# Patient Record
Sex: Female | Born: 1970
Health system: Southern US, Community
[De-identification: ages and names within clinical notes are randomized; demographics above are authoritative.]

## PROBLEM LIST (undated history)

## (undated) DIAGNOSIS — R109 Unspecified abdominal pain: Secondary | ICD-10-CM

## (undated) DIAGNOSIS — F419 Anxiety disorder, unspecified: Secondary | ICD-10-CM

## (undated) DIAGNOSIS — E781 Pure hyperglyceridemia: Secondary | ICD-10-CM

## (undated) DIAGNOSIS — F329 Major depressive disorder, single episode, unspecified: Secondary | ICD-10-CM

## (undated) DIAGNOSIS — E559 Vitamin D deficiency, unspecified: Secondary | ICD-10-CM

## (undated) DIAGNOSIS — E538 Deficiency of other specified B group vitamins: Secondary | ICD-10-CM

## (undated) DIAGNOSIS — F32A Depression, unspecified: Secondary | ICD-10-CM

## (undated) DIAGNOSIS — G5601 Carpal tunnel syndrome, right upper limb: Secondary | ICD-10-CM

## (undated) DIAGNOSIS — D509 Iron deficiency anemia, unspecified: Secondary | ICD-10-CM

## (undated) DIAGNOSIS — R5383 Other fatigue: Secondary | ICD-10-CM

## (undated) HISTORY — DX: Other fatigue: R53.83

## (undated) HISTORY — PX: ESSURE TUBAL LIGATION: SUR464

## (undated) HISTORY — DX: Unspecified abdominal pain: R10.9

## (undated) HISTORY — PX: COLONOSCOPY: SHX174

## (undated) HISTORY — DX: Deficiency of other specified B group vitamins: E53.8

---
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
## 1998-01-11 ENCOUNTER — Other Ambulatory Visit: Admission: RE | Admit: 1998-01-11 | Discharge: 1998-01-11 | Payer: Self-pay | Admitting: Obstetrics & Gynecology

## 1999-01-02 ENCOUNTER — Other Ambulatory Visit: Admission: RE | Admit: 1999-01-02 | Discharge: 1999-01-02 | Payer: Self-pay | Admitting: Obstetrics & Gynecology

## 2002-01-29 ENCOUNTER — Other Ambulatory Visit: Admission: RE | Admit: 2002-01-29 | Discharge: 2002-01-29 | Payer: Self-pay | Admitting: Obstetrics and Gynecology

## 2003-07-16 ENCOUNTER — Other Ambulatory Visit: Admission: RE | Admit: 2003-07-16 | Discharge: 2003-07-16 | Payer: Self-pay | Admitting: Obstetrics and Gynecology

## 2004-08-27 HISTORY — PX: SKULL FRACTURE ELEVATION: SHX781

## 2005-04-27 ENCOUNTER — Other Ambulatory Visit: Admission: RE | Admit: 2005-04-27 | Discharge: 2005-04-27 | Payer: Self-pay | Admitting: Obstetrics and Gynecology

## 2005-09-17 ENCOUNTER — Ambulatory Visit: Payer: Self-pay | Admitting: Internal Medicine

## 2006-05-29 ENCOUNTER — Ambulatory Visit: Payer: Self-pay | Admitting: Internal Medicine

## 2006-08-29 ENCOUNTER — Ambulatory Visit: Payer: Self-pay | Admitting: Internal Medicine

## 2007-01-06 ENCOUNTER — Ambulatory Visit: Payer: Self-pay | Admitting: Internal Medicine

## 2007-01-08 ENCOUNTER — Ambulatory Visit: Payer: Self-pay | Admitting: Internal Medicine

## 2007-03-12 ENCOUNTER — Ambulatory Visit: Payer: Self-pay | Admitting: Internal Medicine

## 2007-03-12 DIAGNOSIS — R42 Dizziness and giddiness: Secondary | ICD-10-CM | POA: Insufficient documentation

## 2007-03-13 ENCOUNTER — Telehealth: Payer: Self-pay | Admitting: Internal Medicine

## 2007-03-13 ENCOUNTER — Encounter: Payer: Self-pay | Admitting: Internal Medicine

## 2007-03-13 LAB — CONVERTED CEMR LAB
Bacteria, UA: NONE SEEN
RBC / HPF: NONE SEEN (ref <3)

## 2007-03-14 ENCOUNTER — Encounter (INDEPENDENT_AMBULATORY_CARE_PROVIDER_SITE_OTHER): Payer: Self-pay | Admitting: *Deleted

## 2007-03-16 ENCOUNTER — Encounter: Admission: RE | Admit: 2007-03-16 | Discharge: 2007-03-16 | Payer: Self-pay | Admitting: Internal Medicine

## 2007-03-17 ENCOUNTER — Encounter (INDEPENDENT_AMBULATORY_CARE_PROVIDER_SITE_OTHER): Payer: Self-pay | Admitting: *Deleted

## 2007-03-18 ENCOUNTER — Encounter (INDEPENDENT_AMBULATORY_CARE_PROVIDER_SITE_OTHER): Payer: Self-pay | Admitting: *Deleted

## 2007-03-18 ENCOUNTER — Telehealth (INDEPENDENT_AMBULATORY_CARE_PROVIDER_SITE_OTHER): Payer: Self-pay | Admitting: *Deleted

## 2007-12-08 ENCOUNTER — Encounter: Admission: RE | Admit: 2007-12-08 | Discharge: 2007-12-08 | Payer: Self-pay | Admitting: Obstetrics and Gynecology

## 2007-12-26 ENCOUNTER — Encounter: Admission: RE | Admit: 2007-12-26 | Discharge: 2007-12-26 | Payer: Self-pay | Admitting: Obstetrics and Gynecology

## 2008-02-18 ENCOUNTER — Ambulatory Visit: Payer: Self-pay | Admitting: Internal Medicine

## 2008-02-18 LAB — CONVERTED CEMR LAB: Rapid Strep: NEGATIVE

## 2008-05-10 ENCOUNTER — Ambulatory Visit: Payer: Self-pay | Admitting: Internal Medicine

## 2008-05-13 ENCOUNTER — Telehealth: Payer: Self-pay | Admitting: Internal Medicine

## 2008-05-31 ENCOUNTER — Ambulatory Visit: Payer: Self-pay | Admitting: Family Medicine

## 2008-05-31 DIAGNOSIS — E54 Ascorbic acid deficiency: Secondary | ICD-10-CM

## 2008-05-31 DIAGNOSIS — S9030XA Contusion of unspecified foot, initial encounter: Secondary | ICD-10-CM

## 2008-06-01 ENCOUNTER — Telehealth (INDEPENDENT_AMBULATORY_CARE_PROVIDER_SITE_OTHER): Payer: Self-pay | Admitting: *Deleted

## 2008-07-26 ENCOUNTER — Encounter: Payer: Self-pay | Admitting: Gastroenterology

## 2008-07-28 ENCOUNTER — Encounter: Payer: Self-pay | Admitting: Gastroenterology

## 2009-03-07 ENCOUNTER — Ambulatory Visit: Payer: Self-pay | Admitting: Internal Medicine

## 2009-03-07 DIAGNOSIS — K5900 Constipation, unspecified: Secondary | ICD-10-CM | POA: Insufficient documentation

## 2009-03-07 DIAGNOSIS — R5381 Other malaise: Secondary | ICD-10-CM | POA: Insufficient documentation

## 2009-03-07 DIAGNOSIS — R141 Gas pain: Secondary | ICD-10-CM

## 2009-03-07 DIAGNOSIS — R5383 Other fatigue: Secondary | ICD-10-CM

## 2009-03-07 DIAGNOSIS — R109 Unspecified abdominal pain: Secondary | ICD-10-CM | POA: Insufficient documentation

## 2009-03-07 DIAGNOSIS — R143 Flatulence: Secondary | ICD-10-CM

## 2009-03-07 DIAGNOSIS — R142 Eructation: Secondary | ICD-10-CM

## 2009-03-07 LAB — CONVERTED CEMR LAB
Bilirubin Urine: NEGATIVE
Ketones, urine, test strip: NEGATIVE
Nitrite: NEGATIVE
Specific Gravity, Urine: 1.015
Urobilinogen, UA: 0.2

## 2009-03-08 ENCOUNTER — Encounter: Payer: Self-pay | Admitting: Internal Medicine

## 2009-03-10 ENCOUNTER — Encounter (INDEPENDENT_AMBULATORY_CARE_PROVIDER_SITE_OTHER): Payer: Self-pay | Admitting: *Deleted

## 2009-03-10 ENCOUNTER — Telehealth (INDEPENDENT_AMBULATORY_CARE_PROVIDER_SITE_OTHER): Payer: Self-pay | Admitting: *Deleted

## 2009-03-11 LAB — CONVERTED CEMR LAB
Alkaline Phosphatase: 32 units/L — ABNORMAL LOW (ref 39–117)
Amylase: 64 units/L (ref 27–131)
Bilirubin, Direct: 0.2 mg/dL (ref 0.0–0.3)
Eosinophils Absolute: 0.2 10*3/uL (ref 0.0–0.7)
Free T4: 0.8 ng/dL (ref 0.6–1.6)
Lipase: 12 units/L (ref 11.0–59.0)
Lymphocytes Relative: 17.9 % (ref 12.0–46.0)
MCHC: 33.8 g/dL (ref 30.0–36.0)
MCV: 98.5 fL (ref 78.0–100.0)
Monocytes Absolute: 0.2 10*3/uL (ref 0.1–1.0)
Neutrophils Relative %: 78.2 % — ABNORMAL HIGH (ref 43.0–77.0)
Platelets: 262 10*3/uL (ref 150.0–400.0)
RBC: 4.03 M/uL (ref 3.87–5.11)
Total Bilirubin: 1 mg/dL (ref 0.3–1.2)
WBC: 11.3 10*3/uL — ABNORMAL HIGH (ref 4.5–10.5)

## 2009-04-05 ENCOUNTER — Ambulatory Visit: Payer: Self-pay | Admitting: Gastroenterology

## 2009-04-05 ENCOUNTER — Telehealth: Payer: Self-pay | Admitting: Gastroenterology

## 2009-04-05 DIAGNOSIS — E538 Deficiency of other specified B group vitamins: Secondary | ICD-10-CM | POA: Insufficient documentation

## 2009-04-05 LAB — CONVERTED CEMR LAB
Ferritin: 53.2 ng/mL (ref 10.0–291.0)
Sed Rate: 10 mm/hr (ref 0–22)
Vitamin B-12: 94 pg/mL — ABNORMAL LOW (ref 211–911)

## 2009-04-06 ENCOUNTER — Ambulatory Visit: Payer: Self-pay | Admitting: Gastroenterology

## 2009-04-14 ENCOUNTER — Telehealth (INDEPENDENT_AMBULATORY_CARE_PROVIDER_SITE_OTHER): Payer: Self-pay | Admitting: *Deleted

## 2009-04-19 ENCOUNTER — Ambulatory Visit: Payer: Self-pay | Admitting: Gastroenterology

## 2009-04-27 ENCOUNTER — Ambulatory Visit: Payer: Self-pay | Admitting: Gastroenterology

## 2009-05-20 ENCOUNTER — Ambulatory Visit: Payer: Self-pay | Admitting: Gastroenterology

## 2009-05-23 ENCOUNTER — Ambulatory Visit: Payer: Self-pay | Admitting: Gastroenterology

## 2009-05-23 ENCOUNTER — Telehealth (INDEPENDENT_AMBULATORY_CARE_PROVIDER_SITE_OTHER): Payer: Self-pay | Admitting: *Deleted

## 2009-05-23 DIAGNOSIS — R1084 Generalized abdominal pain: Secondary | ICD-10-CM | POA: Insufficient documentation

## 2009-05-23 DIAGNOSIS — R109 Unspecified abdominal pain: Secondary | ICD-10-CM | POA: Insufficient documentation

## 2010-04-13 ENCOUNTER — Ambulatory Visit: Payer: Self-pay | Admitting: Internal Medicine

## 2010-04-13 DIAGNOSIS — J019 Acute sinusitis, unspecified: Secondary | ICD-10-CM

## 2010-08-11 ENCOUNTER — Ambulatory Visit (HOSPITAL_COMMUNITY)
Admission: RE | Admit: 2010-08-11 | Discharge: 2010-08-11 | Payer: Self-pay | Source: Home / Self Care | Attending: Obstetrics and Gynecology | Admitting: Obstetrics and Gynecology

## 2010-09-17 ENCOUNTER — Encounter: Payer: Self-pay | Admitting: Obstetrics and Gynecology

## 2010-09-24 LAB — CONVERTED CEMR LAB
Basophils Relative: 0.6 % (ref 0.0–1.0)
Beta hcg, urine, semiquantitative: NEGATIVE
Bilirubin Urine: NEGATIVE
CO2: 27 meq/L (ref 19–32)
Calcium: 9.6 mg/dL (ref 8.4–10.5)
Chloride: 105 meq/L (ref 96–112)
Creatinine, Ser: 0.7 mg/dL (ref 0.4–1.2)
Eosinophils Relative: 1.6 % (ref 0.0–5.0)
GFR calc non Af Amer: 101 mL/min
Glucose, Bld: 86 mg/dL (ref 70–99)
HCT: 40.2 % (ref 36.0–46.0)
Hemoglobin: 14.4 g/dL
Ketones, urine, test strip: NEGATIVE
MCV: 94.3 fL (ref 78.0–100.0)
Neutrophils Relative %: 71.6 % (ref 43.0–77.0)
Nitrite: NEGATIVE
Platelets: 347 10*3/uL (ref 150–400)
RBC: 4.26 M/uL (ref 3.87–5.11)
RDW: 12 % (ref 11.5–14.6)
Sodium: 140 meq/L (ref 135–145)
Specific Gravity, Urine: 1.01
WBC: 9.6 10*3/uL (ref 4.5–10.5)
pH: 7

## 2010-09-26 NOTE — Assessment & Plan Note (Signed)
Summary: HEAD CONGESTION, NO COUGH, SINCE SUNDAY, NO FEVER///SPH   Vital Signs:  Patient profile:   40 year old female Height:      68 inches (172.72 cm) Weight:      139 pounds (63.18 kg) BMI:     21.21 Temp:     98.3 degrees F (36.83 degrees C) oral Resp:     14  per minute BP sitting:   110 / 74  (left arm) Cuff size:   regular  Vitals Entered By: Lucious Groves CMA (April 13, 2010 1:51 PM) CC: C/O congestion x5 days./kb, URI symptoms Is Patient Diabetic? No Pain Assessment Patient in pain? yes     Location: body aches Intensity: 4-5 Type: aching Onset of pain  Sunday Comments Patient states that she is producing yellow mucous and has HA, but denies fever and cough. Patient also notes that the only med she is taking is the birth control. Lucious Groves CMA  April 13, 2010 1:52 PM    Primary Care Provider:  Chrissie Noa Hopper,MD  CC:  C/O congestion x5 days./kb and URI symptoms.  History of Present Illness:  URI Symptoms      This is a 40 year old woman who presents with URI symptoms since 08/14.  The patient reports nasal congestion and purulent nasal discharge, but denies sore throat, productive cough, and earache.  The patient denies fever, dyspnea, and wheezing.  The patient also reports frontal  headache.  The patient denies itchy watery eyes and sneezing.  Risk factors for Strep sinusitis include  facial pain.  The patient denies the following risk factors for Strep sinusitis: tooth pain.   Rx: Nyquil, decongestant  Current Medications (verified): 1)  Bcp 2)  Amitiza 8 Mcg  Caps (Lubiprostone) .Marland Kitchen.. 1 Two Times A Day/take With Food and Water 3)  Miralax   Powd (Polyethylene Glycol 3350) .... Mix One Packet in [employer] and Drink At Night 4)  Folic Acid 1 Mg Tabs (Folic Acid) .... Take One By Mouth Once Daily 5)  Nascobal 500 Mcg/0.82ml Soln (Cyanocobalamin) .... One Spray, One Nostril, Once A Week  Allergies (verified): 1)  ! Pcn  Physical Exam  General:  in no acute  distress; alert,appropriate and cooperative throughout examination Ears:  External ear exam shows no significant lesions or deformities.  Otoscopic examination reveals clear canals, tympanic membranes are intact bilaterally without bulging, retraction, inflammation or discharge. Hearing is grossly normal bilaterally. Nose:  External nasal examination shows no deformity or inflammation. Nasal mucosa are pink and moist without lesions or exudates. Hyponasal speech Mouth:  Oral mucosa and oropharynx without lesions or exudates.  Teeth in good repair No pharyngeal erythema.   Slightly hoarse Lungs:  Normal respiratory effort, chest expands symmetrically. Lungs are clear to auscultation, no crackles or wheezes. Cervical Nodes:  No lymphadenopathy noted Axillary Nodes:  No palpable lymphadenopathy   Impression & Recommendations:  Problem # 1:  SINUSITIS- ACUTE-NOS (ICD-461.9)  Her updated medication list for this problem includes:    Clarithromycin 500 Mg Xr24h-tab (Clarithromycin) .Marland Kitchen... 2 once daily with a meal    Fluticasone Propionate 50 Mcg/act Susp (Fluticasone propionate) .Marland Kitchen... 1 spray two times a day  Complete Medication List: 1)  Bcp  2)  Amitiza 8 Mcg Caps (Lubiprostone) .Marland Kitchen.. 1 two times a day/take with food and water 3)  Miralax Powd (Polyethylene glycol 3350) .... Mix one packet in water and drink at night 4)  Folic Acid 1 Mg Tabs (Folic acid) .... Take one by mouth once  daily 5)  Nascobal 500 Mcg/0.59ml Soln (Cyanocobalamin) .... One spray, one nostril, once a week 6)  Clarithromycin 500 Mg Xr24h-tab (Clarithromycin) .... 2 once daily with a meal 7)  Fluticasone Propionate 50 Mcg/act Susp (Fluticasone propionate) .Marland Kitchen.. 1 spray two times a day  Patient Instructions: 1)  Neti pot once daily until sinuses are clear. 2)  Drink as much fluid as you can tolerate for the next few days. Prescriptions: FLUTICASONE PROPIONATE 50 MCG/ACT SUSP (FLUTICASONE PROPIONATE) 1 spray two times a day   #1 x 5   Entered and Authorized by:   Marga Melnick MD   Signed by:   Marga Melnick MD on 04/13/2010   Method used:   Faxed to ...       [facility]  [address] (retail)       [address]       [place], [place]  [address]       Ph: [phone] or [phone]       Fax: [phone]   RxID:   [id number] CLARITHROMYCIN 500 MG XR24H-TAB (CLARITHROMYCIN) 2 once daily with a meal  #20 x 0   Entered and Authorized by:   Marga Melnick MD   Signed by:   Marga Melnick MD on 04/13/2010   Method used:   Faxed to ...       [facility]  [address] (retail)       [address]       [place], [place]  [address]       Ph: [phone] or [phone]       Fax: [phone]   RxID:   [id number]

## 2011-08-29 ENCOUNTER — Ambulatory Visit (INDEPENDENT_AMBULATORY_CARE_PROVIDER_SITE_OTHER): Payer: PRIVATE HEALTH INSURANCE | Admitting: Family Medicine

## 2011-08-29 ENCOUNTER — Encounter: Payer: Self-pay | Admitting: Family Medicine

## 2011-08-29 ENCOUNTER — Telehealth: Payer: Self-pay

## 2011-08-29 VITALS — BP 118/83 | HR 82 | Temp 98.2°F | Wt 143.0 lb

## 2011-08-29 DIAGNOSIS — J069 Acute upper respiratory infection, unspecified: Secondary | ICD-10-CM

## 2011-08-29 DIAGNOSIS — J111 Influenza due to unidentified influenza virus with other respiratory manifestations: Secondary | ICD-10-CM

## 2011-08-29 DIAGNOSIS — R6889 Other general symptoms and signs: Secondary | ICD-10-CM

## 2011-08-29 MED ORDER — HYDROCODONE-HOMATROPINE 5-1.5 MG/5ML PO SYRP: ORAL_SOLUTION | ORAL | Status: DC
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
## 2011-08-29 NOTE — Assessment & Plan Note (Addendum)
Suspect flu-like illness.   Also with slight suggestion of RAD. Symptomatic care at this point: hycodan 1-2 tsp q6h prn. Mucinex DM as directed in daytime. Saline nasal spray 2-3 times per day.   OTC generic afrin as directed.  Therapeutic expectations and side effect profile of medication discussed today.  Patient's questions answered.

## 2011-08-29 NOTE — Patient Instructions (Signed)
Buy generic afrin and use as directed for nasal congestion. Buy saline nasal spray and use 2-3 sprays each nostril 2-3 times per day.

## 2011-08-29 NOTE — Telephone Encounter (Signed)
Patient called c/o sore throat, congestion, chills and aches. Patient aware all schedules booked here. Patient was offered to be seen at [facility], patient ok'd.  I contacted the [facility] location to check availability, instructed to have patient contact there office to schedule 10:30 or 10:45.  Number given to patient, patient will call for appointment for Acute Visit only

## 2011-09-03 ENCOUNTER — Ambulatory Visit (INDEPENDENT_AMBULATORY_CARE_PROVIDER_SITE_OTHER): Payer: PRIVATE HEALTH INSURANCE | Admitting: Family Medicine

## 2011-09-03 ENCOUNTER — Encounter: Payer: Self-pay | Admitting: Family Medicine

## 2011-09-03 VITALS — BP 127/85 | HR 71 | Temp 97.8°F | Wt 140.0 lb

## 2011-09-03 DIAGNOSIS — J019 Acute sinusitis, unspecified: Secondary | ICD-10-CM

## 2011-09-03 DIAGNOSIS — J45909 Unspecified asthma, uncomplicated: Secondary | ICD-10-CM

## 2011-09-03 MED ORDER — PREDNISONE 20 MG PO TABS: ORAL_TABLET | ORAL | Status: DC

## 2011-09-03 MED ORDER — ALBUTEROL SULFATE (2.5 MG/3ML) 0.083% IN NEBU
2.5000 mg | INHALATION_SOLUTION | RESPIRATORY_TRACT | Status: DC
  Administered 2011-09-03: 2.5 mg via RESPIRATORY_TRACT

## 2011-09-03 MED ORDER — BENZONATATE 200 MG PO CAPS: 200.0000 mg | ORAL_CAPSULE | Freq: Three times a day (TID) | ORAL | Status: AC | PRN

## 2011-09-03 MED ORDER — AZITHROMYCIN 250 MG PO TABS: ORAL_TABLET | ORAL | Status: DC

## 2011-09-03 MED ORDER — ALBUTEROL SULFATE HFA 108 (90 BASE) MCG/ACT IN AERS: 2.0000 | INHALATION_SPRAY | Freq: Four times a day (QID) | RESPIRATORY_TRACT | Status: DC | PRN

## 2011-09-03 NOTE — Progress Notes (Signed)
OFFICE NOTE  09/03/2011  CC:  Chief Complaint  Patient presents with  . Cough    cough and congestion worse     HPI: Patient is a 41 y.o. Caucasian female who is here for ongoing respiratory complaints. Ten days total of URI sx's and coughing, coughing getting worse/chest tight/wheezy. Fevers finally stopped 3d/a, body aches are gone. She took hycodan for a couple of days q6h and then began to note significant itching all over and felt her gums swelling. She stopped it and these things resolved.  She says she has taken vicodin in the past w/out problem. No rash.  Mild HA at times.  No ST.  Pertinent PMH:  Past Medical History  Diagnosis Date  . Vitamin B12 deficiency   . Constipation   . Fatigue   . Abdominal pain, recurrent   Nonsmoker. No hx of asthma.  Pertinent Meds:  None currently  PE: Blood pressure 127/85, pulse 71, temperature 97.8 F (36.6 C), temperature source Temporal, weight 140 lb (63.504 kg), SpO2 99.00%. Gen: Alert, tired-appearing but in NAD/nontoxic.  Patient is oriented to person, place, time, and situation. VS: noted--normal. HEENT: eyes without injection, drainage, or swelling.  Ears: EACs clear, TMs with normal light reflex and landmarks.  Nose: Clear rhinorrhea, with some dried, crusty exudate adherent to mildly injected mucosa.  No purulent d/c.  No paranasal sinus TTP.  No facial swelling.  Throat and mouth without focal lesion.  No pharyngial swelling, erythema, or exudate.   Neck: supple, no LAD.   LUNGS: CTA bilat on inspiration, but has some coarse wheeze diffusely on exhalation with lots of post-exhalational coughing, nonlabored resps.  CV: RRR, no m/r/g. EXT: no c/c/e SKIN: no rash  Lab: none  IMPRESSION AND PLAN: Sinusitis with acute asthmatic bronchitis--likely all complications from flu-like illness recently. Albut neb 2.5mg  in office today: improved aeration, less coughing. Question of allergy to hycodan (?homotropine  component). Start tessalon perles, Ventolin HFA 2 p q4h prn, and prednisone 40mg  qd x 5, then 20mg  qd x 5d. Therapeutic expectations and side effect profile of medication discussed today.  Patient's questions answered.   FOLLOW UP: 1 wk

## 2011-09-10 ENCOUNTER — Encounter: Payer: Self-pay | Admitting: Internal Medicine

## 2011-09-10 ENCOUNTER — Ambulatory Visit (INDEPENDENT_AMBULATORY_CARE_PROVIDER_SITE_OTHER): Payer: PRIVATE HEALTH INSURANCE | Admitting: Internal Medicine

## 2011-09-10 ENCOUNTER — Ambulatory Visit (INDEPENDENT_AMBULATORY_CARE_PROVIDER_SITE_OTHER)
Admission: RE | Admit: 2011-09-10 | Discharge: 2011-09-10 | Disposition: A | Discharge status: Home / Self Care | Payer: PRIVATE HEALTH INSURANCE | Source: Ambulatory Visit | Attending: Internal Medicine | Admitting: Internal Medicine

## 2011-09-10 VITALS — BP 122/82 | HR 82 | Temp 98.7°F | Resp 16 | Wt 142.4 lb

## 2011-09-10 DIAGNOSIS — R05 Cough: Secondary | ICD-10-CM

## 2011-09-10 DIAGNOSIS — R059 Cough, unspecified: Secondary | ICD-10-CM

## 2011-09-10 MED ORDER — FLUTICASONE-SALMETEROL 250-50 MCG/DOSE IN AEPB: 1.0000 | INHALATION_SPRAY | Freq: Two times a day (BID) | RESPIRATORY_TRACT | Status: DC

## 2011-09-10 MED ORDER — MONTELUKAST SODIUM 10 MG PO TABS: 10.0000 mg | ORAL_TABLET | Freq: Every day | ORAL | Status: DC

## 2011-09-10 NOTE — Progress Notes (Signed)
  Subjective:    Patient ID: Alyssa Baker, female    DOB: Jul 14, 1971, 41 y.o.   MRN: [id number]  HPI Respiratory tract infection: Bronchitis and sinusitis treated at [facility] 09/04/11 with Z-Pak, Tessalon,albuterol MDI and prednisone. Progression of symptoms:initial improvement but cough and wheezing persist with SOB Present symptoms; Fever/chills/sweats:sweats & chills Frontal headache:no Facial pain:no Nasal purulence:no Sore throat:no Dental pain:no Lymphadenopathy:no Cough/sputum/hemoptysis:NP Pleuritic pain:no Associated extrinsic/allergic symptoms:itchy eyes/ sneezing:no Past medical history: Seasonal allergies;yes/asthma:no Smoking history:never     Review of Systems     Objective:   Physical Exam General appearance:thin but in good health ;well nourished; no acute distress or increased work of breathing is present.  No  lymphadenopathy about the head, neck, or axilla noted.   Eyes: No conjunctival inflammation or lid edema is present.   Ears:  External ear exam shows no significant lesions or deformities.  Otoscopic examination reveals clear canals, tympanic membranes are intact bilaterally without bulging, retraction, inflammation or discharge.  Nose:  External nasal examination shows no deformity or inflammation. Nasal mucosa are pink and moist without lesions or exudates. No septal dislocation or deviation.No obstruction to airflow.   Oral exam: Dental hygiene is good; lips and gums are healthy appearing.There is no oropharyngeal erythema or exudate noted.   Neck:  No deformities, thyromegaly, masses, or tenderness noted.   Supple with full range of motion without pain.   Heart:  Normal rate and regular rhythm. S1 and S2 normal without gallop, murmur, click, rub or other extra sounds.   Lungs:Chest clear to auscultation; no wheezes, rhonchi,rales ,or rubs present.No increased work of breathing. Although she exhibits no wheezing at this time; she coughs with almost every  deep inspiration.    Extremities:  No cyanosis, edema, or clubbing  noted    Skin: Warm but slightly damp        Assessment & Plan:   #1 reactive airways findings following respiratory infection. At this time she has no criteria for rhinosinusitis. The cough is nonproductive and most likely related to airway spasm.  Plan: See orders and recommendations

## 2011-09-10 NOTE — Patient Instructions (Signed)
Plain Mucinex for thick secretions ;force NON dairy fluids . Use a Neti pot daily as needed for sinus congestion .Order for x-rays entered into  the computer; these will be performed at [address] [facility]. across from [facility]. No appointment is necessary.

## 2012-04-01 ENCOUNTER — Ambulatory Visit (INDEPENDENT_AMBULATORY_CARE_PROVIDER_SITE_OTHER): Payer: Self-pay | Admitting: Family Medicine

## 2012-04-01 ENCOUNTER — Encounter: Payer: Self-pay | Admitting: Family Medicine

## 2012-04-01 VITALS — BP 116/72 | HR 80 | Temp 98.2°F | Wt 142.8 lb

## 2012-04-01 DIAGNOSIS — R197 Diarrhea, unspecified: Secondary | ICD-10-CM

## 2012-04-01 LAB — CBC WITH DIFFERENTIAL/PLATELET
Eosinophils Absolute: 0.3 10*3/uL (ref 0.0–0.7)
Eosinophils Relative: 3.3 % (ref 0.0–5.0)
HCT: 38.9 % (ref 36.0–46.0)
Lymphs Abs: 1.7 10*3/uL (ref 0.7–4.0)
MCHC: 33.4 g/dL (ref 30.0–36.0)
MCV: 98.6 fl (ref 78.0–100.0)
Monocytes Absolute: 0.6 10*3/uL (ref 0.1–1.0)
Platelets: 299 10*3/uL (ref 150.0–400.0)
RDW: 12.8 % (ref 11.5–14.6)
WBC: 7.8 10*3/uL (ref 4.5–10.5)

## 2012-04-01 LAB — BASIC METABOLIC PANEL
BUN: 14 mg/dL (ref 6–23)
CO2: 27 mEq/L (ref 19–32)
Chloride: 105 mEq/L (ref 96–112)
Glucose, Bld: 76 mg/dL (ref 70–99)
Potassium: 4.3 mEq/L (ref 3.5–5.1)

## 2012-04-01 LAB — LIPASE: Lipase: 30 U/L (ref 11.0–59.0)

## 2012-04-01 LAB — HEPATIC FUNCTION PANEL
Albumin: 4.4 g/dL (ref 3.5–5.2)
Alkaline Phosphatase: 43 U/L (ref 39–117)
Total Protein: 8 g/dL (ref 6.0–8.3)

## 2012-04-01 MED ORDER — HYOSCYAMINE SULFATE 0.125 MG SL SUBL: 0.1250 mg | SUBLINGUAL_TABLET | SUBLINGUAL | Status: DC | PRN

## 2012-04-01 NOTE — Progress Notes (Signed)
  Subjective:     Alyssa Baker is a 41 y.o. female who presents for evaluation of diarrhea 10 times per day, nausea and chills. Symptoms have been present for 7 days. Patient denies acholic stools, blood in stool, constipation, dark urine, dysuria, heartburn, hematemesis, hematuria and melena. Patient's oral intake has been decreased for liquids and decreased for solids. Patient's urine output has been decreased with 2 voids in 24 hours, the last void was a few hours ago. Other contacts with similar symptoms include: none. Patient denies recent travel history. Patient has not had recent ingestion of possible contaminated food, toxic plants, or inappropriate medications/poisons.   The following portions of the patient's history were reviewed and updated as appropriate: allergies, current medications, past family history, past medical history, past social history, past surgical history and problem list.  Review of Systems Pertinent items are noted in HPI.    Objective:     BP 116/72  Pulse 80  Temp 98.2 F (36.8 C) (Oral)  Wt 142 lb 12.8 oz (64.774 kg)  SpO2 97% General appearance: alert, cooperative, appears stated age and no distress Lungs: clear to auscultation bilaterally Heart: S1, S2 normal Abdomen: abnormal findings:  mild tenderness in the epigastrium    Assessment:    Diarrhea---  ? etiology  Plan:    1. Discussed oral rehydration, reintroduction of solid foods, signs of dehydration. 2. Return or go to emergency department if worsening symptoms, blood or bile, signs of dehydration, diarrhea lasting longer than 5 days or any new concerns. 3. Follow up in 2 weeks or sooner as needed.  4  Check cultures 5.  levsin SL prn

## 2012-04-01 NOTE — Patient Instructions (Signed)
Diarrhea Infections caused by germs (bacterial) or a virus commonly cause diarrhea. Your caregiver has determined that with time, rest and fluids, the diarrhea should improve. In general, eat normally while drinking more water than usual. Although water may prevent dehydration, it does not contain salt and minerals (electrolytes). Broths, weak tea without caffeine and oral rehydration solutions (ORS) replace fluids and electrolytes. Small amounts of fluids should be taken frequently. Large amounts at one time may not be tolerated. Plain water may be harmful in infants and the elderly. Oral rehydrating solutions (ORS) are available at pharmacies and grocery stores. ORS replace water and important electrolytes in proper proportions. Sports drinks are not as effective as ORS and may be harmful due to sugars worsening diarrhea.  ORS is especially recommended for use in children with diarrhea. As a general guideline for children, replace any new fluid losses from diarrhea and/or vomiting with ORS as follows:   If your child weighs 22 pounds or under (10 kg or less), give 60-120 mL ( -  cup or 2 - 4 ounces) of ORS for each episode of diarrheal stool or vomiting episode.   If your child weighs more than 22 pounds (more than 10 kgs), give 120-240 mL ( - 1 cup or 4 - 8 ounces) of ORS for each diarrheal stool or episode of vomiting.   While correcting for dehydration, children should eat normally. However, foods high in sugar should be avoided because this may worsen diarrhea. Large amounts of carbonated soft drinks, juice, gelatin desserts and other highly sugared drinks should be avoided.   After correction of dehydration, other liquids that are appealing to the child may be added. Children should drink small amounts of fluids frequently and fluids should be increased as tolerated. Children should drink enough fluids to keep urine clear or pale yellow.   Adults should eat normally while drinking more fluids  than usual. Drink small amounts of fluids frequently and increase as tolerated. Drink enough fluids to keep urine clear or pale yellow. Broths, weak decaffeinated tea, lemon lime soft drinks (allowed to go flat) and ORS replace fluids and electrolytes.   Avoid:   Carbonated drinks.   Juice.   Extremely hot or cold fluids.   Caffeine drinks.   Fatty, greasy foods.   Alcohol.   Tobacco.   Too much intake of anything at one time.   Gelatin desserts.   Probiotics are active cultures of beneficial bacteria. They may lessen the amount and number of diarrheal stools in adults. Probiotics can be found in yogurt with active cultures and in supplements.   Wash hands well to avoid spreading bacteria and virus.   Anti-diarrheal medications are not recommended for infants and children.   Only take over-the-counter or prescription medicines for pain, discomfort or fever as directed by your caregiver. Do not give aspirin to children because it may cause Reye's Syndrome.   For adults, ask your caregiver if you should continue all prescribed and over-the-counter medicines.   If your caregiver has given you a follow-up appointment, it is very important to keep that appointment. Not keeping the appointment could result in a chronic or permanent injury, and disability. If there is any problem keeping the appointment, you must call back to this facility for assistance.  SEEK IMMEDIATE MEDICAL CARE IF:   You or your child is unable to keep fluids down or other symptoms or problems become worse in spite of treatment.   Vomiting or diarrhea develops and becomes persistent.     There is vomiting of blood or bile (green material).   There is blood in the stool or the stools are black and tarry.   There is no urine output in 6-8 hours or there is only a small amount of very dark urine.   Abdominal pain develops, increases or localizes.   You have a fever.   Your baby is older than 3 months with a  rectal temperature of 102 F (38.9 C) or higher.   Your baby is 3 months old or younger with a rectal temperature of 100.4 F (38 C) or higher.   You or your child develops excessive weakness, dizziness, fainting or extreme thirst.   You or your child develops a rash, stiff neck, severe headache or become irritable or sleepy and difficult to awaken.  MAKE SURE YOU:   Understand these instructions.   Will watch your condition.   Will get help right away if you are not doing well or get worse.  Document Released: 08/03/2002 Document Revised: 08/02/2011 Document Reviewed: 06/20/2009 Exit[employer] Patient Information 2012 [employer]. 

## 2012-04-08 ENCOUNTER — Encounter: Payer: Self-pay | Admitting: *Deleted

## 2012-04-09 ENCOUNTER — Encounter: Payer: Self-pay | Admitting: *Deleted

## 2012-04-09 LAB — CLOSTRIDIUM DIFFICILE EIA: CDIFTX: NEGATIVE

## 2012-06-30 ENCOUNTER — Encounter: Payer: Self-pay | Admitting: Internal Medicine

## 2012-06-30 ENCOUNTER — Ambulatory Visit (INDEPENDENT_AMBULATORY_CARE_PROVIDER_SITE_OTHER): Payer: BC Managed Care – PPO | Admitting: Internal Medicine

## 2012-06-30 VITALS — BP 118/76 | HR 89 | Temp 98.6°F | Wt 150.2 lb

## 2012-06-30 DIAGNOSIS — IMO0001 Reserved for inherently not codable concepts without codable children: Secondary | ICD-10-CM

## 2012-06-30 DIAGNOSIS — G44309 Post-traumatic headache, unspecified, not intractable: Secondary | ICD-10-CM
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
## 2012-06-30 NOTE — Patient Instructions (Addendum)
Please keep a diary of your headaches . Document  each occurrence on the calendar with notation of : #1 any prodrome ( any non headache symptom such as marked fatigue,visual changes, ,etc ) which precedes actual headache ; #2) severity on 1-10 scale; #3) any triggers ( food/ drink,enviromenntal or weather changes ,physical or emotional stress) in 8-12 hour period prior to the headache; & #4) response to any medications or other intervention. Please review "Headache" @ [facility] for additional information.    Review and correct the record as indicated. Please share record with all medical staff seen.   If you activate My Chart; the results can be released to you as soon as they populate from the lab. If you choose not to use this program; the labs have to be reviewed, copied & mailed   causing a delay in getting the results to you.

## 2012-07-02 ENCOUNTER — Encounter: Payer: Self-pay | Admitting: Internal Medicine

## 2012-07-04 ENCOUNTER — Other Ambulatory Visit: Payer: BC Managed Care – PPO

## 2012-07-11 ENCOUNTER — Ambulatory Visit (INDEPENDENT_AMBULATORY_CARE_PROVIDER_SITE_OTHER)
Admission: RE | Admit: 2012-07-11 | Discharge: 2012-07-11 | Disposition: A | Discharge status: Home / Self Care | Payer: BC Managed Care – PPO | Source: Ambulatory Visit | Attending: Internal Medicine | Admitting: Internal Medicine

## 2012-07-11 DIAGNOSIS — S0990XS Unspecified injury of head, sequela: Secondary | ICD-10-CM

## 2012-07-11 DIAGNOSIS — G44309 Post-traumatic headache, unspecified, not intractable: Secondary | ICD-10-CM

## 2012-10-11 ENCOUNTER — Other Ambulatory Visit: Payer: Self-pay

## 2012-10-14 ENCOUNTER — Ambulatory Visit: Payer: BC Managed Care – PPO | Admitting: Family Medicine

## 2012-10-17 ENCOUNTER — Ambulatory Visit: Payer: BC Managed Care – PPO | Admitting: Internal Medicine

## 2012-11-20 ENCOUNTER — Other Ambulatory Visit: Payer: Self-pay

## 2012-11-20 DIAGNOSIS — Z1231 Encounter for screening mammogram for malignant neoplasm of breast: Secondary | ICD-10-CM

## 2012-12-12 ENCOUNTER — Ambulatory Visit: Payer: BC Managed Care – PPO

## 2012-12-16 ENCOUNTER — Ambulatory Visit
Admission: RE | Admit: 2012-12-16 | Discharge: 2012-12-16 | Disposition: A | Discharge status: Home / Self Care | Payer: PRIVATE HEALTH INSURANCE | Source: Ambulatory Visit

## 2012-12-16 DIAGNOSIS — Z1231 Encounter for screening mammogram for malignant neoplasm of breast: Secondary | ICD-10-CM

## 2013-03-19 ENCOUNTER — Encounter: Payer: Self-pay | Admitting: Internal Medicine

## 2013-03-19 ENCOUNTER — Ambulatory Visit (INDEPENDENT_AMBULATORY_CARE_PROVIDER_SITE_OTHER): Payer: PRIVATE HEALTH INSURANCE | Admitting: Internal Medicine

## 2013-03-19 VITALS — BP 118/72 | HR 72 | Temp 98.4°F | Wt 147.0 lb

## 2013-03-19 DIAGNOSIS — R112 Nausea with vomiting, unspecified: Secondary | ICD-10-CM

## 2013-03-19 DIAGNOSIS — R197 Diarrhea, unspecified: Secondary | ICD-10-CM

## 2013-03-19 NOTE — Patient Instructions (Addendum)
Stay on clear liquids for 48-72 hours or until bowels are normal.This would include  jello, sherbert (NOT ice cream), Lipton's chicken noodle soup(NOT cream based soups),Gatorade Lite, flat Ginger ale (without High Fructose Corn Syrup),dry toast or crackers, baked potato.No milk , dairy or grease until bowels are formed.  Take Pepto-Bismol tablets as per the label instructions until the diarrhea is resolved. Take  Immodium AD for frankly watery stool if Pepto-Bismol does not control the diarrhea. Please do not drink from well. Nurse, learning disability, a [employer] , daily if stools are loose.  Report increasing pain, fever or rectal bleeding.  MiraLax every third day would be the best treatment for chronic constipation.

## 2013-03-19 NOTE — Progress Notes (Signed)
  Subjective:    Patient ID: Alyssa Baker, female    DOB: Jun 27, 1971, 42 y.o.   MRN: [id number]  HPI   Symptoms began 03/15/13 in the evening as nausea. This persisted and as of the evening of 7/21 she had vomiting x2  On 7/22 she experienced fever, chills, sweats  She went to work 7/23 and had nausea and vomiting twice.  During this period time the stools have been very loose to frankly watery. She also describes some epigastric discomfort in her stomach is empty. She may have lost approximately 3 pounds. She describes decreased urine output and feeling lightheaded.  She has not treated this with any medications as constipation has been a chronic problem  Her boyfriend is recovering from similar GI picture.She does occasionally drink water from a well. She's had no antibiotics in the last 3 months and has had no suspect food exposure. There's been no foreign travel  She does have a history of intermittent abdominal bloating and abdominal pain in lower quadrants. Colonoscopy had been negative except for diminutive colon. Her mother had colon polyps    Review of Systems    She denies dysuria, pyuria, or hematuria  There's been no associated rash or skin lesions.       Objective:   Physical Exam General appearance: thin , is one of good health and nourishment w/o distress.  Eyes: No conjunctival inflammation or scleral icterus is present.  Oral exam: Dental hygiene is good; lips and gums are healthy appearing.There is no oropharyngeal erythema or exudate noted.   Heart:  Normal rate and regular rhythm. S1 and S2 normal without gallop, murmur, click, rub or other extra sounds     Lungs:Chest clear to auscultation; no wheezes, rhonchi,rales ,or rubs present.No increased work of breathing.   Abdomen: bowel sounds normal, soft but slight RUQ tenderness without masses, organomegaly or hernias noted.  No guarding or rebound   Skin:Warm & dry.  Intact without suspicious lesions or  rashes ; no jaundice or tenting  Lymphatic: No lymphadenopathy is noted about the head, neck, axilla           Assessment & Plan:  #1 gastroenteritis #2 probable underlying IBS Plan: see orders

## 2013-07-02 ENCOUNTER — Other Ambulatory Visit: Payer: Self-pay

## 2013-08-10 ENCOUNTER — Ambulatory Visit (INDEPENDENT_AMBULATORY_CARE_PROVIDER_SITE_OTHER): Payer: PRIVATE HEALTH INSURANCE | Admitting: Internal Medicine

## 2013-08-10 ENCOUNTER — Encounter: Payer: Self-pay | Admitting: Internal Medicine

## 2013-08-10 VITALS — BP 102/67 | HR 85 | Temp 98.7°F | Wt 150.6 lb

## 2013-08-10 DIAGNOSIS — J0110 Acute frontal sinusitis, unspecified: Secondary | ICD-10-CM

## 2013-08-10 DIAGNOSIS — R05 Cough: Secondary | ICD-10-CM

## 2013-08-10 DIAGNOSIS — J0100 Acute maxillary sinusitis, unspecified: Secondary | ICD-10-CM

## 2013-08-10 MED ORDER — BENZONATATE 200 MG PO CAPS: 200.0000 mg | ORAL_CAPSULE | Freq: Three times a day (TID) | ORAL | Status: DC | PRN

## 2013-08-10 MED ORDER — CLARITHROMYCIN ER 500 MG PO TB24: 1000.0000 mg | ORAL_TABLET | Freq: Every day | ORAL | Status: DC

## 2013-08-10 NOTE — Patient Instructions (Signed)

## 2013-08-10 NOTE — Progress Notes (Signed)
   Subjective:    Patient ID: Alyssa Baker, female    DOB: 12/25/1970, 42 y.o.   MRN: [id number]  HPI   Symptoms began 07/23/2013 and sneezing, head congestion, cough, sweats, arthralgias/myalgias, and chills.  She complains of frontal sinus pain, maxillary sinus pain, yellow-green nasal discharge, and otic pain.  The cough is productive of dark green sputum but only in the mornings.  She also has had ongoing chills and sweats without fever.  She has improved somewhat with NyQuil at night, Mucinex, Robitussin the morning.  She's never smoked.    Review of Systems  She has not had itchy eyes but has had some watery eyes.  He is not having dental pain or otic discharge  Shortness of breath has been minor and not associated with wheezing.     Objective:   Physical Exam General appearance:good health ;well nourished; no acute distress or increased work of breathing is present.  No  lymphadenopathy about the head, neck, or axilla noted.   Eyes: No conjunctival inflammation or lid edema is present.  Ears:  External ear exam shows no significant lesions or deformities.  Otoscopic examination reveals clear canals, tympanic membranes are intact bilaterally without bulging, retraction, inflammation or discharge.  Nose:  External nasal examination shows no deformity or inflammation. Nasal mucosa are pink and moist without lesions or exudates. Minimal R  septal  deviation. Marked hyponasal speech pattern  Oral exam: Dental hygiene is good; lips and gums are healthy appearing.There is no oropharyngeal erythema or exudate noted.   Neck:  No deformities,  masses, or tenderness noted.   Supple with full range of motion without pain.   Heart:  Normal rate and regular rhythm. S1 and S2 normal without gallop, murmur, click, rub or other extra sounds.   Lungs:Chest clear to auscultation; no wheezes, rhonchi,rales ,or rubs present.No increased work of breathing.    Extremities:  No cyanosis,  edema, or clubbing  noted    Skin: Warm & dry .         Assessment & Plan:  #1 acute frontal and maxillary sinusitis  #2 purulent a.m. sputum is most likely postnasal drainage as lungs are clear  #3 penicillin and Hycodan allergies/intolerance  See orders

## 2013-08-10 NOTE — Progress Notes (Signed)
Pre visit review using our clinic review tool, if applicable. No additional management support is needed unless otherwise documented below in the visit note. 

## 2013-09-08 ENCOUNTER — Encounter: Payer: Self-pay | Admitting: Nurse Practitioner

## 2013-09-08 ENCOUNTER — Ambulatory Visit (INDEPENDENT_AMBULATORY_CARE_PROVIDER_SITE_OTHER): Payer: PRIVATE HEALTH INSURANCE | Admitting: Nurse Practitioner

## 2013-09-08 VITALS — BP 100/70 | HR 78 | Temp 98.4°F | Ht 68.0 in | Wt 152.0 lb

## 2013-09-08 DIAGNOSIS — J019 Acute sinusitis, unspecified: Secondary | ICD-10-CM

## 2013-09-08 MED ORDER — DOXYCYCLINE HYCLATE 100 MG PO TABS: 100.0000 mg | ORAL_TABLET | Freq: Two times a day (BID) | ORAL | Status: DC

## 2013-09-08 NOTE — Progress Notes (Signed)
Pre-visit discussion using our clinic review tool. No additional management support is needed unless otherwise documented below in the visit note.  

## 2013-09-08 NOTE — Progress Notes (Signed)
   Subjective:    Patient ID: Alyssa MulletJerri Baker, female    DOB: Dec 26, 1970, 43 y.o.   MRN: [id number]  Sinusitis This is a chronic problem. The current episode started more than 1 month ago (6-8 weeks). The problem has been waxing and waning since onset. There has been no fever. The pain is mild. Associated symptoms include congestion, coughing, ear pain (feel stuffy), headaches and sinus pressure. Pertinent negatives include no chills, shortness of breath or sore throat. Past treatments include antibiotics (mucinex). The treatment provided mild relief.      Review of Systems  Constitutional: Negative for fever, chills and fatigue.  HENT: Positive for congestion, ear pain (feel stuffy), postnasal drip and sinus pressure. Negative for sore throat.   Respiratory: Positive for cough. Negative for chest tightness, shortness of breath and wheezing.   Gastrointestinal: Negative for abdominal pain.  Musculoskeletal: Negative for back pain.  Neurological: Positive for headaches.  Hematological: Negative for adenopathy.       Objective:   Physical Exam  Vitals reviewed. Constitutional: She is oriented to person, place, and time. She appears well-developed and well-nourished. No distress.  HENT:  Head: Normocephalic and atraumatic.  Right Ear: External ear normal.  Left Ear: External ear normal.  Mouth/Throat: Oropharynx is clear and moist. No oropharyngeal exudate.  Purulent nasal dc  Eyes: Conjunctivae are normal. Right eye exhibits no discharge. Left eye exhibits no discharge.  Neck: Normal range of motion. Neck supple. No thyromegaly present.  Cardiovascular: Normal rate, regular rhythm and normal heart sounds.   No murmur heard. Pulmonary/Chest: Effort normal and breath sounds normal. No respiratory distress. She has no wheezes.  Lymphadenopathy:    She has cervical adenopathy (bilat ant cervical LAD. NT, moveable).  Neurological: She is alert and oriented to person, place, and time.  Skin:  Skin is warm and dry.  Psychiatric: She has a normal mood and affect. Her behavior is normal. Thought content normal.          Assessment & Plan:  1. Acute sinusitis 6-8 wk duration.  - doxycycline (VIBRA-TABS) 100 MG tablet; Take 1 tablet (100 mg total) by mouth 2 (two) times daily.  Dispense: 14 tablet; Refill: 0

## 2013-09-08 NOTE — Patient Instructions (Signed)
Start daily sinus rinses (Neilmed Sinus rinse). You may use pseudoephedrine 30 mg twice daily for at least 5 days. Start antibiotic if no improvement after 3 days. Please call for re-evaluation if you are not improving.  Sinusitis Sinusitis is redness, soreness, and swelling (inflammation) of the paranasal sinuses. Paranasal sinuses are air pockets within the bones of your face (beneath the eyes, the middle of the forehead, or above the eyes). In healthy paranasal sinuses, mucus is able to drain out, and air is able to circulate through them by way of your nose. However, when your paranasal sinuses are inflamed, mucus and air can become trapped. This can allow bacteria and other germs to grow and cause infection. Sinusitis can develop quickly and last only a short time (acute) or continue over a long period (chronic). Sinusitis that lasts for more than 12 weeks is considered chronic.  CAUSES  Causes of sinusitis include:  Allergies.  Structural abnormalities, such as displacement of the cartilage that separates your nostrils (deviated septum), which can decrease the air flow through your nose and sinuses and affect sinus drainage.  Functional abnormalities, such as when the small hairs (cilia) that line your sinuses and help remove mucus do not work properly or are not present. SYMPTOMS  Symptoms of acute and chronic sinusitis are the same. The primary symptoms are pain and pressure around the affected sinuses. Other symptoms include:  Upper toothache.  Earache.  Headache.  Bad breath.  Decreased sense of smell and taste.  A cough, which worsens when you are lying flat.  Fatigue.  Fever.  Thick drainage from your nose, which often is green and may contain pus (purulent).  Swelling and warmth over the affected sinuses. DIAGNOSIS  Your caregiver will perform a physical exam. During the exam, your caregiver may:  Look in your nose for signs of abnormal growths in your nostrils  (nasal polyps).  Tap over the affected sinus to check for signs of infection.  View the inside of your sinuses (endoscopy) with a special imaging device with a light attached (endoscope), which is inserted into your sinuses. If your caregiver suspects that you have chronic sinusitis, one or more of the following tests may be recommended:  Allergy tests.  Nasal culture A sample of mucus is taken from your nose and sent to a lab and screened for bacteria.  Nasal cytology A sample of mucus is taken from your nose and examined by your caregiver to determine if your sinusitis is related to an allergy. TREATMENT  Most cases of acute sinusitis are related to a viral infection and will resolve on their own within 10 days. Sometimes medicines are prescribed to help relieve symptoms (pain medicine, decongestants, nasal steroid sprays, or saline sprays).  However, for sinusitis related to a bacterial infection, your caregiver will prescribe antibiotic medicines. These are medicines that will help kill the bacteria causing the infection.  Rarely, sinusitis is caused by a fungal infection. In theses cases, your caregiver will prescribe antifungal medicine. For some cases of chronic sinusitis, surgery is needed. Generally, these are cases in which sinusitis recurs more than 3 times per year, despite other treatments. HOME CARE INSTRUCTIONS   Drink plenty of water. Water helps thin the mucus so your sinuses can drain more easily.  Use a humidifier.  Inhale steam 3 to 4 times a day (for example, sit in the bathroom with the shower running).  Apply a warm, moist washcloth to your face 3 to 4 times a day,  or as directed by your caregiver.  Use saline nasal sprays to help moisten and clean your sinuses.  Take over-the-counter or prescription medicines for pain, discomfort, or fever only as directed by your caregiver. SEEK IMMEDIATE MEDICAL CARE IF:  You have increasing pain or severe headaches.  You  have nausea, vomiting, or drowsiness.  You have swelling around your face.  You have vision problems.  You have a stiff neck.  You have difficulty breathing. MAKE SURE YOU:   Understand these instructions.  Will watch your condition.  Will get help right away if you are not doing well or get worse. Document Released: 08/13/2005 Document Revised: 11/05/2011 Document Reviewed: 08/28/2011 [facility] Patient Information 2014 [place], [place].

## 2014-04-02 ENCOUNTER — Encounter: Payer: Self-pay | Admitting: Gastroenterology

## 2014-05-18 ENCOUNTER — Ambulatory Visit (INDEPENDENT_AMBULATORY_CARE_PROVIDER_SITE_OTHER): Payer: BC Managed Care – PPO | Admitting: Internal Medicine

## 2014-05-18 ENCOUNTER — Encounter: Payer: Self-pay | Admitting: Internal Medicine

## 2014-05-18 ENCOUNTER — Other Ambulatory Visit: Payer: BC Managed Care – PPO

## 2014-05-18 VITALS — BP 110/80 | HR 68 | Temp 98.3°F | Wt 153.2 lb

## 2014-05-18 DIAGNOSIS — R82998 Other abnormal findings in urine: Secondary | ICD-10-CM

## 2014-05-18 DIAGNOSIS — R8290 Unspecified abnormal findings in urine: Secondary | ICD-10-CM

## 2014-05-18 DIAGNOSIS — R109 Unspecified abdominal pain: Secondary | ICD-10-CM

## 2014-05-18 DIAGNOSIS — R300 Dysuria: Secondary | ICD-10-CM

## 2014-05-18 LAB — POCT URINALYSIS DIPSTICK
Bilirubin, UA: NEGATIVE
Glucose, UA: NEGATIVE
KETONES UA: NEGATIVE
Leukocytes, UA: NEGATIVE
Nitrite, UA: NEGATIVE
PH UA: 8
RBC UA: NEGATIVE
SPEC GRAV UA: 1.01

## 2014-05-18 MED ORDER — PHENAZOPYRIDINE HCL 200 MG PO TABS: 200.0000 mg | ORAL_TABLET | Freq: Three times a day (TID) | ORAL | Status: DC | PRN

## 2014-05-18 NOTE — Progress Notes (Signed)
Pre visit review using our clinic review tool, if applicable. No additional management support is needed unless otherwise documented below in the visit note. 

## 2014-05-18 NOTE — Progress Notes (Signed)
   Subjective:    Patient ID: Alyssa Baker, female    DOB: 11-Dec-1970, 43 y.o.   MRN: [id number]  HPI    Since 04/25/14 she's had pain with initiation of urination. This has been associated with hesitancy as well as frequency.  She is taking Aleve and cranberry juice with slight benefit  She does have some suprapubic  discomfort leaning forward as well.  She has not been on antibiotics last 30 days. She has no history of recurrent urinary tract infections. She has no history of renal calculi or abnormality of urinary tract. She has never had any urologic procedures  Review of Systems  She denies dysuria or pyuria. She also has no flank pain.  There's been no associated fever, chills, or sweats.  She does have alternating constipation and diarrhea bowel changes. This is been evaluated in detail including colonoscopy. Apparently she has diminutive small bowel        Objective:   Physical Exam   Pertinent or  positive findings include:  She has minimal suprapubic discomfort; otherwise exam is totally normal.  General appearance :adequately nourished; in no distress.  Eyes: No conjunctival inflammation or scleral icterus is present.  Oral exam: Dental hygiene is good. Lips and gums are healthy appearing.There is no oropharyngeal erythema or exudate noted.   Heart:  Normal rate and regular rhythm. S1 and S2 normal without gallop, murmur, click, rub or other extra sounds     Lungs:Chest clear to auscultation; no wheezes, rhonchi,rales ,or rubs present.No increased work of breathing.   Abdomen: bowel sounds normal, soft and non-tender without masses, organomegaly or hernias noted.  No guarding or rebound. No flank tenderness to percussion.  Skin:Warm & dry.  Intact without suspicious lesions or rashes ; no jaundice or tenting  Lymphatic: No lymphadenopathy is noted about the head, neck, axilla.             Assessment & Plan:  #1 dysuria  #2 hesitancy  #3 minor  proteinuria on urinalysis  Plan: Pyridium pending urine culture. If symptoms persist or progress; urologic referral.

## 2014-05-18 NOTE — Patient Instructions (Signed)
Drink as much nondairy fluids as possible. Avoid spicy foods or alcohol as  these may aggravate the bladder. Do not take decongestants. Avoid narcotics if possible. 

## 2014-05-20 LAB — URINE CULTURE: Colony Count: 50000

## 2015-05-16 ENCOUNTER — Ambulatory Visit (INDEPENDENT_AMBULATORY_CARE_PROVIDER_SITE_OTHER): Payer: 59 | Admitting: Primary Care

## 2015-05-16 ENCOUNTER — Encounter: Payer: Self-pay | Admitting: Primary Care

## 2015-05-16 VITALS — BP 116/68 | HR 101 | Temp 98.3°F | Ht 68.0 in | Wt 156.1 lb

## 2015-05-16 DIAGNOSIS — K1120 Sialoadenitis, unspecified: Secondary | ICD-10-CM | POA: Diagnosis not present

## 2015-05-16 LAB — CBC WITH DIFFERENTIAL/PLATELET
BASOS PCT: 0.4 % (ref 0.0–3.0)
Basophils Absolute: 0 10*3/uL (ref 0.0–0.1)
EOS PCT: 1 % (ref 0.0–5.0)
Eosinophils Absolute: 0 10*3/uL (ref 0.0–0.7)
HCT: 37.8 % (ref 36.0–46.0)
Hemoglobin: 12.7 g/dL (ref 12.0–15.0)
LYMPHS ABS: 0.6 10*3/uL — AB (ref 0.7–4.0)
Lymphocytes Relative: 13.6 % (ref 12.0–46.0)
MCHC: 33.5 g/dL (ref 30.0–36.0)
MCV: 96.7 fl (ref 78.0–100.0)
Monocytes Absolute: 0.6 10*3/uL (ref 0.1–1.0)
Monocytes Relative: 15.2 % — ABNORMAL HIGH (ref 3.0–12.0)
NEUTROS ABS: 3 10*3/uL (ref 1.4–7.7)
NEUTROS PCT: 69.8 % (ref 43.0–77.0)
Platelets: 218 10*3/uL (ref 150.0–400.0)
RBC: 3.91 Mil/uL (ref 3.87–5.11)
RDW: 12.6 % (ref 11.5–15.5)
WBC: 4.3 10*3/uL (ref 4.0–10.5)

## 2015-05-16 LAB — COMPREHENSIVE METABOLIC PANEL
ALBUMIN: 4.2 g/dL (ref 3.5–5.2)
ALT: 11 U/L (ref 0–35)
AST: 19 U/L (ref 0–37)
Alkaline Phosphatase: 57 U/L (ref 39–117)
BUN: 12 mg/dL (ref 6–23)
CHLORIDE: 105 meq/L (ref 96–112)
CO2: 29 mEq/L (ref 19–32)
Calcium: 9.1 mg/dL (ref 8.4–10.5)
Creatinine, Ser: 0.63 mg/dL (ref 0.40–1.20)
GFR: 109.52 mL/min (ref >60.00)
GLUCOSE: 85 mg/dL (ref 70–99)
POTASSIUM: 4.5 meq/L (ref 3.5–5.1)
Sodium: 141 mEq/L (ref 135–145)
Total Bilirubin: 0.4 mg/dL (ref 0.2–1.2)
Total Protein: 7.6 g/dL (ref 6.0–8.3)

## 2015-05-16 NOTE — Assessment & Plan Note (Signed)
Moderate facial swelling to left side representing parotitis. +fatigue, fever, body aches. Spouse recently diagnosed with mumps, patient has not been vaccinated. Labs today for mumps IGG and IGM, cbc, cmp and are pending. Discussed supportive treatment with tylenol, fluids, rest. Work note provided as she is contagious.

## 2015-05-16 NOTE — Patient Instructions (Signed)
Complete lab work prior to leaving today. I will notify you of your results.  Continue to wear the mask when out in public.  You may take tylenol for fevers and body aches. Do not exceed 3000 mg in 24 hours.  Ensure that you are staying hydrated with water.   Remain out of work until Monday next week. Please notify me if you start feeling worse, develop high grade fevers, and/or if facial swelling becomes worse.   Please schedule a physical with me in the next 3 months. You will also schedule a lab only appointment one week prior. We will discuss your lab results during your physical.  It was a pleasure to meet you today! Please don't hesitate to call me with any questions. Welcome to [facility]!

## 2015-05-16 NOTE — Progress Notes (Signed)
Pre visit review using our clinic review tool, if applicable. No additional management support is needed unless otherwise documented below in the visit note. 
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
## 2015-05-17 LAB — MUMPS ANTIBODY, IGG: Mumps IgG: 292 AU/mL — ABNORMAL HIGH (ref <9.00)

## 2015-05-19 LAB — MUMPS ANTIBODY, IGM: Mumps IgM Value: <1:20 {titer}

## 2015-05-20 ENCOUNTER — Telehealth: Payer: Self-pay

## 2015-05-20 DIAGNOSIS — K1120 Sialoadenitis, unspecified: Secondary | ICD-10-CM

## 2015-05-20 MED ORDER — CLINDAMYCIN HCL 300 MG PO CAPS: ORAL_CAPSULE | ORAL | Status: DC

## 2015-05-20 NOTE — Telephone Encounter (Signed)
Pt left v/m;pt seen 05/16/15; glands are swollen more than when seen on 05/16/15 and pt wants to know if should return to work on 05/23/15 or stay out of work longer. Pt request cb.

## 2015-05-20 NOTE — Telephone Encounter (Signed)
Spoke with patient regarding concerns. Antibiotics sent in for facial swelling and fever today of 101. She is to update me on Monday morning. Emergency department precautions provided.

## 2015-05-23 ENCOUNTER — Telehealth: Payer: Self-pay | Admitting: Primary Care

## 2015-05-23 ENCOUNTER — Encounter: Payer: Self-pay | Admitting: Primary Care

## 2015-05-23 NOTE — Telephone Encounter (Signed)
Spoke with patient on the phone this morning. Overall reduction in facial swelling, however nausea and vomiting over the weekend. Will send work note, overall feeling better since initiation of antibiotics.

## 2015-08-04 ENCOUNTER — Other Ambulatory Visit: Payer: Self-pay | Admitting: Primary Care

## 2015-08-04 DIAGNOSIS — E538 Deficiency of other specified B group vitamins: Secondary | ICD-10-CM

## 2015-08-04 DIAGNOSIS — Z0000 Encounter for general adult medical examination without abnormal findings: Secondary | ICD-10-CM

## 2015-08-08 ENCOUNTER — Other Ambulatory Visit: Payer: 59

## 2015-08-15 ENCOUNTER — Encounter: Payer: 59 | Admitting: Primary Care

## 2015-09-09 ENCOUNTER — Other Ambulatory Visit (INDEPENDENT_AMBULATORY_CARE_PROVIDER_SITE_OTHER): Payer: BLUE CROSS/BLUE SHIELD

## 2015-09-09 DIAGNOSIS — E538 Deficiency of other specified B group vitamins: Secondary | ICD-10-CM

## 2015-09-09 DIAGNOSIS — Z0000 Encounter for general adult medical examination without abnormal findings: Secondary | ICD-10-CM

## 2015-09-09 LAB — LIPID PANEL
CHOL/HDL RATIO: 1.8 ratio (ref <=5.0)
Cholesterol: 184 mg/dL (ref 125–200)
HDL: 103 mg/dL (ref >=46)
LDL CALC: 67 mg/dL (ref <130)
TRIGLYCERIDES: 71 mg/dL (ref <150)
VLDL: 14 mg/dL (ref <30)

## 2015-09-09 LAB — BASIC METABOLIC PANEL
BUN: 9 mg/dL (ref 7–25)
CHLORIDE: 101 mmol/L (ref 98–110)
CO2: 27 mmol/L (ref 20–31)
Calcium: 9.4 mg/dL (ref 8.6–10.2)
Creat: 0.59 mg/dL (ref 0.50–1.10)
Glucose, Bld: 76 mg/dL (ref 65–99)
POTASSIUM: 4.4 mmol/L (ref 3.5–5.3)
SODIUM: 139 mmol/L (ref 135–146)

## 2015-09-10 LAB — VITAMIN D 25 HYDROXY (VIT D DEFICIENCY, FRACTURES): Vit D, 25-Hydroxy: 27 ng/mL — ABNORMAL LOW (ref 30–100)

## 2015-09-10 LAB — TSH: TSH: 1.803 u[IU]/mL (ref 0.350–4.500)

## 2015-09-10 LAB — VITAMIN B12: VITAMIN B 12: 276 pg/mL (ref 211–911)

## 2015-09-16 ENCOUNTER — Encounter: Payer: Self-pay | Admitting: Primary Care

## 2015-09-16 ENCOUNTER — Ambulatory Visit (INDEPENDENT_AMBULATORY_CARE_PROVIDER_SITE_OTHER): Payer: BLUE CROSS/BLUE SHIELD | Admitting: Primary Care

## 2015-09-16 VITALS — BP 116/72 | HR 69 | Temp 98.1°F | Ht 68.0 in | Wt 150.8 lb

## 2015-09-16 DIAGNOSIS — E559 Vitamin D deficiency, unspecified: Secondary | ICD-10-CM | POA: Diagnosis not present

## 2015-09-16 DIAGNOSIS — E538 Deficiency of other specified B group vitamins: Secondary | ICD-10-CM | POA: Diagnosis not present

## 2015-09-16 DIAGNOSIS — R21 Rash and other nonspecific skin eruption: Secondary | ICD-10-CM | POA: Diagnosis not present

## 2015-09-16 DIAGNOSIS — Z0000 Encounter for general adult medical examination without abnormal findings: Secondary | ICD-10-CM | POA: Diagnosis not present

## 2015-09-16 MED ORDER — METRONIDAZOLE 0.75 % EX GEL: 1.0000 "application " | Freq: Two times a day (BID) | CUTANEOUS | Status: DC

## 2015-09-16 NOTE — Assessment & Plan Note (Signed)
Located to bilateral cheeks since summer 2016. No improvement. Appears to be rosacea. Mild.  Will treat with metronidazole gel PRN. She is to call if no improvement.

## 2015-09-16 NOTE — Assessment & Plan Note (Signed)
Within normal range, lower end. Discussed supplementation once weekly.
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
## 2015-09-16 NOTE — Assessment & Plan Note (Signed)
Tdap, mammogram, and pap UTD. Declines flu. Labs grossly unremarkable except for slightly low vitamin D. Exam with rash that appears to be rosacea to cheeks, otherwise normal. Encouraged her to continue her healthy lifestyle. Follow up in 1 year for repeat physical.

## 2015-09-16 NOTE — Addendum Note (Signed)
Addended by: Doreene Nest on: 09/16/2015 03:46 PM   Modules accepted: Kipp Brood

## 2015-09-16 NOTE — Progress Notes (Signed)
Pre visit review using our clinic review tool, if applicable. No additional management support is needed unless otherwise documented below in the visit note. 

## 2015-09-16 NOTE — Assessment & Plan Note (Signed)
Level of 27. Start 1000 units daily.

## 2015-09-16 NOTE — Patient Instructions (Addendum)
Try taking Melatonin for sleep. This may be purchased over the counter. Take 1-2 hours prior to bed. Start with a lower dose and increase as needed.  Try Metronidazole gel for rash to your face. Apply twice daily for 5-7 days. Please notify me if no improvement.  Continue your efforts towards a healthy lifestyle.  Start vitamin D 1000 units daily to help replenish levels.  Follow up in 1 year for repeat physical or sooner if needed.  It was a pleasure to see you today!

## 2016-03-23 ENCOUNTER — Telehealth: Payer: Self-pay

## 2016-03-23 ENCOUNTER — Encounter: Payer: Self-pay | Admitting: Primary Care

## 2016-03-23 ENCOUNTER — Ambulatory Visit (INDEPENDENT_AMBULATORY_CARE_PROVIDER_SITE_OTHER): Payer: BLUE CROSS/BLUE SHIELD | Admitting: Primary Care

## 2016-03-23 VITALS — BP 112/72 | HR 58 | Temp 98.2°F | Ht 68.0 in | Wt 144.1 lb

## 2016-03-23 DIAGNOSIS — R21 Rash and other nonspecific skin eruption: Secondary | ICD-10-CM

## 2016-03-23 MED ORDER — PREDNISONE 10 MG PO TABS: ORAL_TABLET | ORAL | 0 refills | Status: DC
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
## 2016-03-23 NOTE — Telephone Encounter (Signed)
Pt left vm; pt was seen earlier today and pt wants to know if pt should take all three tabs of prednisone at same time or spread out over the day. Left v/m requesting cb.

## 2016-03-23 NOTE — Progress Notes (Signed)
Pre visit review using our clinic review tool, if applicable. No additional management support is needed unless otherwise documented below in the visit note. 

## 2016-03-23 NOTE — Telephone Encounter (Signed)
Notified patient that she may take all three tablet at once.

## 2016-03-23 NOTE — Patient Instructions (Signed)
Start prednisone tablets. Take three tablets for 2 days, then two tablets for 2 days, then one tablet for 2 days.  Start a daily antihistamine such as Zyrtec, Claritin, Allegra. Take this every day for the next 4 weeks.   Please notify me if no improvement in itching in 1 week.  It was a pleasure to see you today!

## 2016-04-09 ENCOUNTER — Telehealth: Payer: Self-pay | Admitting: Primary Care

## 2016-04-09 DIAGNOSIS — L299 Pruritus, unspecified: Secondary | ICD-10-CM

## 2016-04-09 MED ORDER — HYDROXYZINE HCL 10 MG PO TABS: 10.0000 mg | ORAL_TABLET | Freq: Three times a day (TID) | ORAL | 0 refills | Status: DC | PRN

## 2016-04-09 NOTE — Telephone Encounter (Signed)
Any improvement with prednisone? I have sent in a medication called Hydroxyzine to use as needed for itching. This may cause drowsiness, so please caution her. She may take 1 tablet by mouth three times daily as needed for itching. Please have her e-mail or call me if no improvement in 1 week.

## 2016-04-09 NOTE — Telephone Encounter (Signed)
Spoken and notified patient of Kate's comments. Patient verbalized understanding.  --Patient stated the prednisone did help but it came back.

## 2016-04-09 NOTE — Telephone Encounter (Signed)
Noted  

## 2016-04-09 NOTE — Telephone Encounter (Signed)
Pt left VM on triage phone, was seen on 03/23/16 for rash.  She is stil itching and would like to know if she needs an appointment to be seen again or will PCP call in another rx.  Pt requesting callback [phone]

## 2016-04-16 ENCOUNTER — Telehealth: Payer: Self-pay | Admitting: Primary Care

## 2016-04-16 NOTE — Telephone Encounter (Signed)
Spoken to patient. She said that she is doing better. The bumps on the arms are gone. Still have them on the legs but believe it will go away soon as well. The itching is gone as well.  Patient stated that if the itching comes back. Patient would like to find out what causes it and so maybe prevent future outbreaks.

## 2016-04-16 NOTE — Telephone Encounter (Signed)
Noted. I'm happy to go ahead and set her up for allergy testing if she's agreeable. That would be a good preventative measure.

## 2016-04-16 NOTE — Telephone Encounter (Signed)
-----   Message from Doreene NestKatherine K Benjie Ricketson, NP sent at 04/09/2016  5:04 PM EDT ----- Regarding: Itching Please check on patient. Any improvement in itching?

## 2016-08-30 ENCOUNTER — Other Ambulatory Visit: Payer: Self-pay | Admitting: Primary Care

## 2016-08-30 DIAGNOSIS — Z0000 Encounter for general adult medical examination without abnormal findings: Secondary | ICD-10-CM

## 2016-08-30 DIAGNOSIS — E559 Vitamin D deficiency, unspecified: Secondary | ICD-10-CM

## 2016-09-03 DIAGNOSIS — Z130 Encounter for screening for diseases of the blood and blood-forming organs and certain disorders involving the immune mechanism: Secondary | ICD-10-CM | POA: Diagnosis not present

## 2016-09-03 DIAGNOSIS — Z1389 Encounter for screening for other disorder: Secondary | ICD-10-CM | POA: Diagnosis not present

## 2016-09-03 DIAGNOSIS — Z1231 Encounter for screening mammogram for malignant neoplasm of breast: Secondary | ICD-10-CM | POA: Diagnosis not present

## 2016-09-03 DIAGNOSIS — Z01419 Encounter for gynecological examination (general) (routine) without abnormal findings: Secondary | ICD-10-CM | POA: Diagnosis not present

## 2016-09-03 DIAGNOSIS — Z6822 Body mass index (BMI) 22.0-22.9, adult: Secondary | ICD-10-CM | POA: Diagnosis not present

## 2016-09-10 ENCOUNTER — Other Ambulatory Visit (INDEPENDENT_AMBULATORY_CARE_PROVIDER_SITE_OTHER): Payer: BLUE CROSS/BLUE SHIELD

## 2016-09-10 DIAGNOSIS — Z0000 Encounter for general adult medical examination without abnormal findings: Secondary | ICD-10-CM | POA: Diagnosis not present

## 2016-09-10 DIAGNOSIS — E559 Vitamin D deficiency, unspecified: Secondary | ICD-10-CM

## 2016-09-10 LAB — COMPREHENSIVE METABOLIC PANEL
ALK PHOS: 42 U/L (ref 39–117)
ALT: 20 U/L (ref 0–35)
AST: 19 U/L (ref 0–37)
Albumin: 4.3 g/dL (ref 3.5–5.2)
BUN: 14 mg/dL (ref 6–23)
CO2: 27 mEq/L (ref 19–32)
CREATININE: 0.69 mg/dL (ref 0.40–1.20)
Calcium: 9.4 mg/dL (ref 8.4–10.5)
Chloride: 106 mEq/L (ref 96–112)
GFR: 98 mL/min (ref >60.00)
GLUCOSE: 83 mg/dL (ref 70–99)
POTASSIUM: 5 meq/L (ref 3.5–5.1)
SODIUM: 139 meq/L (ref 135–145)
TOTAL PROTEIN: 7.6 g/dL (ref 6.0–8.3)
Total Bilirubin: 0.5 mg/dL (ref 0.2–1.2)

## 2016-09-10 LAB — LIPID PANEL
Cholesterol: 172 mg/dL (ref 0–200)
HDL: 81.2 mg/dL (ref >39.00)
LDL Cholesterol: 53 mg/dL (ref 0–99)
NonHDL: 90.86
Total CHOL/HDL Ratio: 2
Triglycerides: 191 mg/dL — ABNORMAL HIGH (ref 0.0–149.0)
VLDL: 38.2 mg/dL (ref 0.0–40.0)

## 2016-09-10 LAB — VITAMIN D 25 HYDROXY (VIT D DEFICIENCY, FRACTURES): VITD: 38.78 ng/mL (ref 30.00–100.00)

## 2016-09-17 ENCOUNTER — Encounter: Payer: BLUE CROSS/BLUE SHIELD | Admitting: Primary Care

## 2016-09-17 ENCOUNTER — Encounter: Payer: Self-pay | Admitting: Primary Care

## 2016-09-17 ENCOUNTER — Ambulatory Visit (INDEPENDENT_AMBULATORY_CARE_PROVIDER_SITE_OTHER): Payer: BLUE CROSS/BLUE SHIELD | Admitting: Primary Care

## 2016-09-17 DIAGNOSIS — E559 Vitamin D deficiency, unspecified: Secondary | ICD-10-CM | POA: Diagnosis not present

## 2016-09-17 DIAGNOSIS — E781 Pure hyperglyceridemia: Secondary | ICD-10-CM | POA: Diagnosis not present

## 2016-09-17 DIAGNOSIS — Z0000 Encounter for general adult medical examination without abnormal findings: Secondary | ICD-10-CM

## 2016-09-17 DIAGNOSIS — R21 Rash and other nonspecific skin eruption: Secondary | ICD-10-CM

## 2016-09-17 NOTE — Patient Instructions (Addendum)
Continue your efforts towards a healthy lifestyle.  Start exercising. You should be getting 150 minutes of moderate intensity exercise weekly.  Start a daily multivitamin.  Schedule a lab only appointment in 6 months to recheck your cholesterol. Ensure you come fasting 4 hours prior to this appointment.  Follow up in 1 year for your annual physical or sooner if needed.  It was a pleasure to see you today!

## 2016-09-17 NOTE — Assessment & Plan Note (Signed)
No rash since discontinuation of all OTC meds. No rash on exam today.

## 2016-09-17 NOTE — Assessment & Plan Note (Signed)
Td UTD per patient, declines influenza vaccination. Pap and mammogram UTD, follows with GYN. Commended her on her healthy lifestyle. Exam unremarkable. Labs with elevation of triglycerides which were discussed. Follow up in 1 year for annual physical.

## 2016-09-17 NOTE — Assessment & Plan Note (Signed)
Overall stable. Stopped taking Vitamin D months ago given persistent rash. Do not believe vitamin d was causing rash. Discussed to start multi-vitamin with Vitamin D.

## 2016-09-17 NOTE — Progress Notes (Signed)
Pre visit review using our clinic review tool, if applicable. No additional management support is needed unless otherwise documented below in the visit note. 
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
## 2016-09-17 NOTE — Assessment & Plan Note (Signed)
Trigs of 191 on recent labs which is greater than doubled since last visit. Could be false positive as she was fasting, no family history, and eats very healthy diet. Will repeat in 6 months.

## 2017-03-04 ENCOUNTER — Encounter: Payer: Self-pay | Admitting: Primary Care

## 2017-03-13 ENCOUNTER — Other Ambulatory Visit: Payer: Self-pay | Admitting: Primary Care

## 2017-03-13 DIAGNOSIS — E538 Deficiency of other specified B group vitamins: Secondary | ICD-10-CM

## 2017-03-13 DIAGNOSIS — E559 Vitamin D deficiency, unspecified: Secondary | ICD-10-CM

## 2017-03-13 DIAGNOSIS — E781 Pure hyperglyceridemia: Secondary | ICD-10-CM

## 2017-03-18 ENCOUNTER — Other Ambulatory Visit (INDEPENDENT_AMBULATORY_CARE_PROVIDER_SITE_OTHER): Payer: BLUE CROSS/BLUE SHIELD

## 2017-03-18 ENCOUNTER — Encounter: Payer: Self-pay | Admitting: Primary Care

## 2017-03-18 DIAGNOSIS — E781 Pure hyperglyceridemia: Secondary | ICD-10-CM | POA: Diagnosis not present

## 2017-03-18 DIAGNOSIS — E538 Deficiency of other specified B group vitamins: Secondary | ICD-10-CM | POA: Diagnosis not present

## 2017-03-18 DIAGNOSIS — E559 Vitamin D deficiency, unspecified: Secondary | ICD-10-CM | POA: Diagnosis not present

## 2017-03-18 LAB — LIPID PANEL
Cholesterol: 182 mg/dL (ref 0–200)
HDL: 75.4 mg/dL (ref >39.00)
LDL Cholesterol: 81 mg/dL (ref 0–99)
NonHDL: 106.28
TRIGLYCERIDES: 126 mg/dL (ref 0.0–149.0)
Total CHOL/HDL Ratio: 2
VLDL: 25.2 mg/dL (ref 0.0–40.0)

## 2017-03-18 LAB — VITAMIN D 25 HYDROXY (VIT D DEFICIENCY, FRACTURES): VITD: 44.52 ng/mL (ref 30.00–100.00)

## 2017-03-18 LAB — VITAMIN B12: VITAMIN B 12: 177 pg/mL — AB (ref 211–911)

## 2017-03-18 NOTE — Telephone Encounter (Signed)
Please set up for monthly B 12 injections x 3 months, then needs follow up visit after that. Thanks.

## 2017-03-19 NOTE — Telephone Encounter (Signed)
Spoken to patient was able to schedule nurse visit for patient on 03/27/17

## 2017-03-27 ENCOUNTER — Ambulatory Visit (INDEPENDENT_AMBULATORY_CARE_PROVIDER_SITE_OTHER): Payer: BLUE CROSS/BLUE SHIELD

## 2017-03-27 DIAGNOSIS — E538 Deficiency of other specified B group vitamins: Secondary | ICD-10-CM

## 2017-03-27 MED ORDER — CYANOCOBALAMIN 1000 MCG/ML IJ SOLN
1000.0000 ug | Freq: Once | INTRAMUSCULAR | Status: AC
  Administered 2017-03-27: 1000 ug via INTRAMUSCULAR

## 2017-05-01 ENCOUNTER — Ambulatory Visit (INDEPENDENT_AMBULATORY_CARE_PROVIDER_SITE_OTHER): Payer: BLUE CROSS/BLUE SHIELD

## 2017-05-01 DIAGNOSIS — E538 Deficiency of other specified B group vitamins: Secondary | ICD-10-CM

## 2017-05-01 MED ORDER — CYANOCOBALAMIN 1000 MCG/ML IJ SOLN
1000.0000 ug | Freq: Once | INTRAMUSCULAR | Status: AC
  Administered 2017-05-01: 1000 ug via INTRAMUSCULAR

## 2017-06-05 ENCOUNTER — Telehealth: Payer: Self-pay | Admitting: *Deleted

## 2017-06-05 ENCOUNTER — Ambulatory Visit (INDEPENDENT_AMBULATORY_CARE_PROVIDER_SITE_OTHER): Payer: BLUE CROSS/BLUE SHIELD

## 2017-06-05 DIAGNOSIS — E538 Deficiency of other specified B group vitamins: Secondary | ICD-10-CM | POA: Diagnosis not present

## 2017-06-05 MED ORDER — CYANOCOBALAMIN 1000 MCG/ML IJ SOLN
1000.0000 ug | Freq: Once | INTRAMUSCULAR | Status: AC
  Administered 2017-06-05: 1000 ug via INTRAMUSCULAR

## 2017-06-05 NOTE — Telephone Encounter (Signed)
Patient was in office to get her B12 injections. She states the last time she seen Jae Dire she was told that she was suppose make a f/u appt to she her after her 3rd B12 injection. She is wanting to clarify that with her . If so you can call patient to make that f/u appt. Her contact number is [phone]. Please advise. Thank you

## 2017-06-05 NOTE — Telephone Encounter (Signed)
Please notify patient that after review I just need repeat B 12 in 1 month.  How's she feeling? Any improvement in symptoms?

## 2017-06-06 NOTE — Telephone Encounter (Signed)
Spoken to patient. Lab appt on 07/06/17  Patient feels about the same, no improvement

## 2017-06-07 NOTE — Telephone Encounter (Signed)
Noted, will await labs.

## 2017-06-24 ENCOUNTER — Encounter: Payer: Self-pay | Admitting: Primary Care

## 2017-06-28 ENCOUNTER — Ambulatory Visit: Payer: BLUE CROSS/BLUE SHIELD | Admitting: Family Medicine

## 2017-07-08 ENCOUNTER — Other Ambulatory Visit (INDEPENDENT_AMBULATORY_CARE_PROVIDER_SITE_OTHER): Payer: BLUE CROSS/BLUE SHIELD

## 2017-07-08 DIAGNOSIS — E538 Deficiency of other specified B group vitamins: Secondary | ICD-10-CM | POA: Diagnosis not present

## 2017-07-08 LAB — VITAMIN B12: VITAMIN B 12: 150 pg/mL — AB (ref 211–911)

## 2017-07-10 ENCOUNTER — Ambulatory Visit: Payer: BLUE CROSS/BLUE SHIELD | Admitting: Primary Care

## 2017-07-10 ENCOUNTER — Encounter: Payer: Self-pay | Admitting: Primary Care

## 2017-07-10 VITALS — BP 118/74 | HR 79 | Temp 98.4°F | Ht 68.0 in | Wt 143.4 lb

## 2017-07-10 DIAGNOSIS — F329 Major depressive disorder, single episode, unspecified: Secondary | ICD-10-CM

## 2017-07-10 DIAGNOSIS — E538 Deficiency of other specified B group vitamins: Secondary | ICD-10-CM

## 2017-07-10 DIAGNOSIS — F419 Anxiety disorder, unspecified: Secondary | ICD-10-CM

## 2017-07-10 DIAGNOSIS — F32A Depression, unspecified: Secondary | ICD-10-CM

## 2017-07-10 LAB — CBC
HCT: 38.2 % (ref 36.0–46.0)
HEMOGLOBIN: 12.8 g/dL (ref 12.0–15.0)
MCHC: 33.6 g/dL (ref 30.0–36.0)
MCV: 99.7 fl (ref 78.0–100.0)
Platelets: 277 10*3/uL (ref 150.0–400.0)
RBC: 3.83 Mil/uL — AB (ref 3.87–5.11)
RDW: 12.7 % (ref 11.5–15.5)
WBC: 7.8 10*3/uL (ref 4.0–10.5)

## 2017-07-10 MED ORDER — SERTRALINE HCL 50 MG PO TABS: 50.0000 mg | ORAL_TABLET | Freq: Every day | ORAL | 1 refills | Status: DC
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
## 2017-07-10 NOTE — Assessment & Plan Note (Signed)
Recent level even lower despite monthly injections. Will add in oral B 12 once daily. Also check CBC today.

## 2017-07-10 NOTE — Assessment & Plan Note (Signed)
Present for the past 4 months.  PHQ 9 score of 14 and GAD 7 score of 19 today. Discussed options for treatment including therapy vs medication. She would like to try medication.   Rx for Zoloft 50 mg sent to pharmacy. Patient is to take 1/2 tablet daily for 8 days, then advance to 1 full tablet thereafter. We discussed possible side effects of headache, GI upset, drowsiness, and SI/HI. If thoughts of SI/HI develop, we discussed to present to the emergency immediately. Patient verbalized understanding.   Follow up in 6 weeks.

## 2017-07-10 NOTE — Patient Instructions (Addendum)
Complete lab work prior to leaving today. I will notify you of your results once received.   Start oral Vitamin B 12 tablets. Take 1000 mcg once weekly. Continue monthly B 12 injections.  Start sertraline (Zoloft) 50 mg tablets. Start by taking 1/2 tablet daily for 8 days then advance to one full tablet thereafter.  Schedule a follow up visit with me in 6 weeks.  It was a pleasure to see you today!

## 2017-07-11 ENCOUNTER — Ambulatory Visit: Payer: BLUE CROSS/BLUE SHIELD | Admitting: Primary Care

## 2017-07-12 ENCOUNTER — Encounter: Payer: Self-pay | Admitting: Primary Care

## 2017-07-25 ENCOUNTER — Ambulatory Visit (INDEPENDENT_AMBULATORY_CARE_PROVIDER_SITE_OTHER): Payer: BLUE CROSS/BLUE SHIELD

## 2017-07-25 DIAGNOSIS — E538 Deficiency of other specified B group vitamins: Secondary | ICD-10-CM | POA: Diagnosis not present

## 2017-07-25 MED ORDER — CYANOCOBALAMIN 1000 MCG/ML IJ SOLN
1000.0000 ug | Freq: Once | INTRAMUSCULAR | Status: AC
  Administered 2017-07-25: 1000 ug via INTRAMUSCULAR

## 2017-08-29 ENCOUNTER — Ambulatory Visit: Payer: BLUE CROSS/BLUE SHIELD

## 2017-09-02 ENCOUNTER — Other Ambulatory Visit: Payer: BLUE CROSS/BLUE SHIELD

## 2017-10-01 ENCOUNTER — Encounter: Payer: BLUE CROSS/BLUE SHIELD | Admitting: Primary Care

## 2017-12-31 DIAGNOSIS — Z130 Encounter for screening for diseases of the blood and blood-forming organs and certain disorders involving the immune mechanism: Secondary | ICD-10-CM | POA: Diagnosis not present

## 2017-12-31 DIAGNOSIS — Z1389 Encounter for screening for other disorder: Secondary | ICD-10-CM | POA: Diagnosis not present

## 2017-12-31 DIAGNOSIS — Z6822 Body mass index (BMI) 22.0-22.9, adult: Secondary | ICD-10-CM | POA: Diagnosis not present

## 2017-12-31 DIAGNOSIS — Z01419 Encounter for gynecological examination (general) (routine) without abnormal findings: Secondary | ICD-10-CM | POA: Diagnosis not present

## 2017-12-31 DIAGNOSIS — Z124 Encounter for screening for malignant neoplasm of cervix: Secondary | ICD-10-CM | POA: Diagnosis not present

## 2018-01-03 DIAGNOSIS — Z1231 Encounter for screening mammogram for malignant neoplasm of breast: Secondary | ICD-10-CM | POA: Diagnosis not present

## 2018-10-18 ENCOUNTER — Other Ambulatory Visit: Payer: Self-pay

## 2018-10-18 ENCOUNTER — Other Ambulatory Visit: Payer: Self-pay | Admitting: Family Medicine

## 2018-10-18 ENCOUNTER — Ambulatory Visit
Admission: EM | Admit: 2018-10-18 | Discharge: 2018-10-18 | Disposition: A | Discharge status: Home / Self Care | Payer: BLUE CROSS/BLUE SHIELD | Attending: Family Medicine | Admitting: Family Medicine

## 2018-10-18 DIAGNOSIS — K1370 Unspecified lesions of oral mucosa: Secondary | ICD-10-CM | POA: Diagnosis not present

## 2018-10-18 LAB — POCT RAPID STREP A (OFFICE): RAPID STREP A SCREEN: NEGATIVE

## 2018-10-18 MED ORDER — NYSTATIN 100000 UNIT/ML MT SUSP: 500000.0000 [IU] | Freq: Three times a day (TID) | OROMUCOSAL | 0 refills | Status: DC

## 2018-10-18 MED ORDER — NYSTATIN 100000 UNIT/ML MT SUSP: 500000.0000 [IU] | Freq: Four times a day (QID) | OROMUCOSAL | 0 refills | Status: DC

## 2018-10-18 NOTE — Progress Notes (Signed)
System unavailable. Patient seen in [place] urgent care and nystatin prescribed for oral thrush
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
## 2018-10-18 NOTE — ED Triage Notes (Addendum)
Per pt she started having white patches all in her mouth since Tuesday. Pt is not having any pain or fevers. SAID THERE IS A SLIGHT SORENESS IN THE BACK OF HER throat and it comes and goes.

## 2018-10-18 NOTE — Progress Notes (Signed)
Nystatin rerouted to [facility].

## 2019-01-12 DIAGNOSIS — Z130 Encounter for screening for diseases of the blood and blood-forming organs and certain disorders involving the immune mechanism: Secondary | ICD-10-CM | POA: Diagnosis not present

## 2019-01-12 DIAGNOSIS — Z124 Encounter for screening for malignant neoplasm of cervix: Secondary | ICD-10-CM | POA: Diagnosis not present

## 2019-01-12 DIAGNOSIS — Z0000 Encounter for general adult medical examination without abnormal findings: Secondary | ICD-10-CM | POA: Diagnosis not present

## 2019-01-12 DIAGNOSIS — Z01419 Encounter for gynecological examination (general) (routine) without abnormal findings: Secondary | ICD-10-CM | POA: Diagnosis not present

## 2019-01-12 DIAGNOSIS — Z1231 Encounter for screening mammogram for malignant neoplasm of breast: Secondary | ICD-10-CM | POA: Diagnosis not present

## 2019-01-12 DIAGNOSIS — Z6820 Body mass index (BMI) 20.0-20.9, adult: Secondary | ICD-10-CM | POA: Diagnosis not present

## 2019-01-16 ENCOUNTER — Other Ambulatory Visit: Payer: Self-pay | Admitting: Obstetrics and Gynecology

## 2019-01-16 DIAGNOSIS — R928 Other abnormal and inconclusive findings on diagnostic imaging of breast: Secondary | ICD-10-CM

## 2019-01-22 ENCOUNTER — Other Ambulatory Visit: Payer: BLUE CROSS/BLUE SHIELD

## 2019-01-29 ENCOUNTER — Ambulatory Visit
Admission: RE | Admit: 2019-01-29 | Discharge: 2019-01-29 | Disposition: A | Discharge status: Home / Self Care | Payer: BLUE CROSS/BLUE SHIELD | Source: Ambulatory Visit | Attending: Obstetrics and Gynecology | Admitting: Obstetrics and Gynecology

## 2019-01-29 ENCOUNTER — Other Ambulatory Visit: Payer: Self-pay

## 2019-01-29 ENCOUNTER — Ambulatory Visit: Payer: BLUE CROSS/BLUE SHIELD

## 2019-01-29 DIAGNOSIS — R922 Inconclusive mammogram: Secondary | ICD-10-CM | POA: Diagnosis not present

## 2019-01-29 DIAGNOSIS — R928 Other abnormal and inconclusive findings on diagnostic imaging of breast: Secondary | ICD-10-CM

## 2019-05-23 DIAGNOSIS — Z20828 Contact with and (suspected) exposure to other viral communicable diseases: Secondary | ICD-10-CM | POA: Diagnosis not present

## 2019-06-09 ENCOUNTER — Other Ambulatory Visit: Payer: Self-pay

## 2019-06-09 DIAGNOSIS — Z20822 Contact with and (suspected) exposure to covid-19: Secondary | ICD-10-CM

## 2019-06-09 DIAGNOSIS — Z20828 Contact with and (suspected) exposure to other viral communicable diseases: Secondary | ICD-10-CM | POA: Diagnosis not present

## 2019-06-11 LAB — NOVEL CORONAVIRUS, NAA: SARS-CoV-2, NAA: NOT DETECTED

## 2019-08-27 ENCOUNTER — Ambulatory Visit
Admission: EM | Admit: 2019-08-27 | Discharge: 2019-08-27 | Disposition: A | Discharge status: Home / Self Care | Payer: BC Managed Care – PPO | Attending: Physician Assistant | Admitting: Physician Assistant

## 2019-08-27 ENCOUNTER — Other Ambulatory Visit: Payer: Self-pay

## 2019-08-27 DIAGNOSIS — R399 Unspecified symptoms and signs involving the genitourinary system: Secondary | ICD-10-CM | POA: Diagnosis not present

## 2019-08-27 DIAGNOSIS — N23 Unspecified renal colic: Secondary | ICD-10-CM

## 2019-08-27 DIAGNOSIS — Z3202 Encounter for pregnancy test, result negative: Secondary | ICD-10-CM | POA: Diagnosis not present

## 2019-08-27 LAB — POCT URINALYSIS DIP (MANUAL ENTRY)
Bilirubin, UA: NEGATIVE
Glucose, UA: NEGATIVE mg/dL
Ketones, POC UA: NEGATIVE mg/dL
Leukocytes, UA: NEGATIVE
Nitrite, UA: NEGATIVE
Protein Ur, POC: 100 mg/dL — AB
Spec Grav, UA: >=1.03 — AB (ref 1.010–1.025)
Urobilinogen, UA: 0.2 E.U./dL
pH, UA: 6 (ref 5.0–8.0)

## 2019-08-27 LAB — POCT URINE PREGNANCY: Preg Test, Ur: NEGATIVE

## 2019-08-27 MED ORDER — TAMSULOSIN HCL 0.4 MG PO CAPS: 0.4000 mg | ORAL_CAPSULE | Freq: Every day | ORAL | 0 refills | Status: DC

## 2019-08-27 NOTE — Discharge Instructions (Signed)
Urine did not show infection, will send for culture. However, did show blood. Start flomax for possible kidney stone causing symptoms. Keep hydrated, urine should be clear to pale yellow in color. Follow up with PCP/urology if symptoms not improving. If unable to urinate, go to the ED for further evaluation needed.

## 2019-08-27 NOTE — ED Triage Notes (Signed)
Pt c/o urinary burning, frequency and urgency x1wk

## 2019-08-27 NOTE — ED Provider Notes (Signed)
[name]    CSN: [id number] Arrival date & time: 08/27/19  0831      History   Chief Complaint Chief Complaint  Patient presents with  . Urinary Tract Infection    HPI Alyssa Baker is a 48 y.o. female.   48 year old female comes in for 1 week history of urinary symptoms.  She has had urinary frequency, dysuria, urinary urgency.  States has also had dribbling of her urine, and trouble starting the stream.  She has had intermittent suprapubic abdominal pain that is worse with urination.  Denies nausea, vomiting.  Denies fever, chills, back/flank pain.  Denies vaginal discharge, itching, spotting.  Does have slight vaginal odor.  LMP 07/20/2019.  No worries for STDs/pregnancy.  Has been trying cranberry juice, and AZO without relief.  Denies history of kidney stones.     Past Medical History:  Diagnosis Date  . Abdominal pain, recurrent    PMH of  . Fatigue   . Vitamin B12 deficiency     Patient Active Problem List   Diagnosis Date Noted  . Anxiety and depression 07/10/2017  . Hypertriglyceridemia 09/17/2016  . Vitamin D deficiency 09/16/2015  . Rash and nonspecific skin eruption 09/16/2015  . Preventative health care 09/16/2015  . ABDOMINAL PAIN-MULTIPLE SITES 05/23/2009  . Vitamin B 12 deficiency 04/05/2009  . ABDOMINAL BLOATING 03/07/2009  . ASCORBIC ACID DEFICIENCY 05/31/2008    Past Surgical History:  Procedure Laterality Date  . COLONOSCOPY     negative except small colon  . SKULL FRACTURE ELEVATION  2006   Metal plates inserted (Horse accident).    OB History   No obstetric history on file.      Home Medications    Prior to Admission medications   Medication Sig Start Date End Date Taking? Authorizing Provider  tamsulosin (FLOMAX) 0.4 MG CAPS capsule Take 1 capsule (0.4 mg total) by mouth daily. 08/27/19   Ok Edwards, PA-C    Family History Family History  Problem Relation Age of Onset  . CVA Mother        post TIAs  . Colon  polyps Mother   . Breast cancer Maternal Aunt   . Hypertension Neg Hx   . Heart disease Neg Hx     Social History Social History   Tobacco Use  . Smoking status: Never Smoker  . Smokeless tobacco: Never Used  Substance Use Topics  . Alcohol use: Yes    Alcohol/week: 0.0 standard drinks    Comment:  socially  . Drug use: No     Allergies   Hycodan [hydrocodone-homatropine] and Penicillins   Review of Systems Review of Systems  Reason unable to perform ROS: See HPI as above.     Physical Exam Triage Vital Signs ED Triage Vitals  Enc Vitals Group     BP 08/27/19 0847 113/77     Pulse Rate 08/27/19 0847 91     Resp 08/27/19 0847 18     Temp 08/27/19 0847 98.2 F (36.8 C)     Temp Source 08/27/19 0847 Oral     SpO2 08/27/19 0847 96 %     Weight --      Height --      Head Circumference --      Peak Flow --      Pain Score 08/27/19 0848 0     Pain Loc --      Pain Edu? --      Excl. in [place]? --  No data found.  Updated Vital Signs BP 113/77 (BP Location: Left Arm)   Pulse 91   Temp 98.2 F (36.8 C) (Oral)   Resp 18   LMP 07/20/2019   SpO2 96%   Physical Exam Constitutional:      General: She is not in acute distress.    Appearance: She is well-developed. She is not ill-appearing, toxic-appearing or diaphoretic.  HENT:     Head: Normocephalic and atraumatic.  Eyes:     Conjunctiva/sclera: Conjunctivae normal.     Pupils: Pupils are equal, round, and reactive to light.  Cardiovascular:     Rate and Rhythm: Normal rate and regular rhythm.     Heart sounds: Normal heart sounds. No murmur. No friction rub. No gallop.   Pulmonary:     Effort: Pulmonary effort is normal.     Breath sounds: Normal breath sounds. No wheezing or rales.  Abdominal:     General: Bowel sounds are normal.     Palpations: Abdomen is soft.     Tenderness: There is no abdominal tenderness. There is no right CVA tenderness, left CVA tenderness, guarding or rebound.  Skin:     General: Skin is warm and dry.  Neurological:     Mental Status: She is alert and oriented to person, place, and time.  Psychiatric:        Behavior: Behavior normal.        Judgment: Judgment normal.      UC Treatments / Results  Labs (all labs ordered are listed, but only abnormal results are displayed) Labs Reviewed  POCT URINALYSIS DIP (MANUAL ENTRY) - Abnormal; Notable for the following components:      Result Value   Spec Grav, UA >=1.030 (*)    Blood, UA small (*)    Protein Ur, POC =100 (*)    All other components within normal limits  POCT URINE PREGNANCY - Normal  URINE CULTURE    EKG   Radiology No results found.  Procedures Procedures (including critical care time)  Medications Ordered in UC Medications - No data to display  Initial Impression / Assessment and Plan / UC Course  I have reviewed the triage vital signs and the nursing notes.  Pertinent labs & imaging results that were available during my care of the patient were reviewed by me and considered in my medical decision making (see chart for details).    Urine dipstick with small blood, negative leukocytes/nitrite.  Discussed current history and exam more worrisome for urethral stone versus UTI.  Will send for urine culture at this time.  Otherwise, will provide patient with Flomax, and have patient stay hydrated.  Discussed if symptoms not improving, to follow-up with PCP/urology for further evaluation needed.  If unable to urinate, to go to the ED for further evaluation.  Other return precautions given.  Patient expresses understanding and agrees to plan.  Final Clinical Impressions(s) / UC Diagnoses   Final diagnoses:  Lower urinary tract symptoms  Renal colic on left side   ED Prescriptions    Medication Sig Dispense Auth. Provider   tamsulosin (FLOMAX) 0.4 MG CAPS capsule Take 1 capsule (0.4 mg total) by mouth daily. 10 capsule Belinda Fisher, PA-C     PDMP not reviewed this encounter.    Belinda Fisher, PA-C 08/27/19 1109

## 2019-08-28 LAB — URINE CULTURE: Culture: NO GROWTH

## 2019-09-10 DIAGNOSIS — R3129 Other microscopic hematuria: Secondary | ICD-10-CM | POA: Diagnosis not present

## 2019-09-10 DIAGNOSIS — R3121 Asymptomatic microscopic hematuria: Secondary | ICD-10-CM | POA: Diagnosis not present

## 2019-09-10 DIAGNOSIS — R109 Unspecified abdominal pain: Secondary | ICD-10-CM | POA: Diagnosis not present

## 2019-09-22 DIAGNOSIS — N3289 Other specified disorders of bladder: Secondary | ICD-10-CM | POA: Diagnosis not present

## 2019-09-22 DIAGNOSIS — R3121 Asymptomatic microscopic hematuria: Secondary | ICD-10-CM | POA: Diagnosis not present

## 2019-09-22 DIAGNOSIS — R3982 Chronic bladder pain: Secondary | ICD-10-CM | POA: Diagnosis not present

## 2019-09-23 ENCOUNTER — Other Ambulatory Visit: Payer: Self-pay | Admitting: Urology

## 2019-09-29 ENCOUNTER — Other Ambulatory Visit: Payer: Self-pay | Admitting: Urology

## 2019-09-30 ENCOUNTER — Other Ambulatory Visit: Payer: Self-pay

## 2019-09-30 ENCOUNTER — Encounter (HOSPITAL_BASED_OUTPATIENT_CLINIC_OR_DEPARTMENT_OTHER): Payer: Self-pay | Admitting: Urology

## 2019-09-30 NOTE — Progress Notes (Signed)
Spoke with:  Augusto Gamble NPO:  After Midnight, no gum, candy, or mints   Arrival time: 1030 Labs: UPT AM medications: None Pre op orders: Yes Ride home: Riley Lam (husband) [phone]

## 2019-09-30 NOTE — Progress Notes (Signed)
PCP - Dr. Allayne Gitelman Cardiologist - N/A  Chest x-ray - N/A EKG - N/A Stress Test - N/A ECHO - N/A Cardiac Cath - N/A  Sleep Study - N/A CPAP - N/A  Fasting Blood Sugar - N/A Checks Blood Sugar __N/A___ times a day  Blood Thinner Instructions: N/A  Aspirin Instructions:  N/A Last Dose: N/A   Anesthesia review:  N/A  Patient denies shortness of breath, fever, cough and chest pain at PAT appointment   Patient verbalized understanding of instructions that were given to them at the PAT appointment. Patient was also instructed that they will need to review over the PAT instructions again at home before surgery.

## 2019-10-02 ENCOUNTER — Other Ambulatory Visit (HOSPITAL_COMMUNITY)
Admission: RE | Admit: 2019-10-02 | Discharge: 2019-10-02 | Disposition: A | Discharge status: Home / Self Care | Payer: BC Managed Care – PPO | Source: Ambulatory Visit | Attending: Urology | Admitting: Urology

## 2019-10-02 DIAGNOSIS — Z01812 Encounter for preprocedural laboratory examination: Secondary | ICD-10-CM | POA: Diagnosis not present

## 2019-10-02 DIAGNOSIS — Z20822 Contact with and (suspected) exposure to covid-19: Secondary | ICD-10-CM | POA: Insufficient documentation

## 2019-10-02 LAB — SARS CORONAVIRUS 2 (TAT 6-24 HRS): SARS Coronavirus 2: NEGATIVE

## 2019-10-05 NOTE — H&P (Signed)
Office Visit Report     09/22/2019   --------------------------------------------------------------------------------   Alyssa Baker  MRN: [id number]  DOB: 26-Jan-1971, 49 year old Female  SSN:    PRIMARY CARE:  Vernona Rieger, NP  REFERRING:    PROVIDER:  Jerilee Field, M.D.  LOCATION:  [facility] [phone]     --------------------------------------------------------------------------------   CC: I have pain in the abdomen.  HPI: Alyssa Baker is a 49 year-old female established patient who is here for abdominal pain.  Her pain started about approximately 07/30/2019. The pain is sharp. The pain is intermittent. The pain does radiate.   None< makes the pain better. Nothing causes the pain to become worse. She has not been treated with any pain medications.   About 6 weeks of symptoms. Took abx for "UTI". She was seen 08/27/2019 for frequency, dysuria and urgency. She had some hesitancy and suprapubic pain. UA was rather bland with negative LE, negative nitrite, small blood. Culture was negative. No imaging. She had an intermittent burning sensation and SP discomfort with low back. Also low bad pain. Movement doesn't change. Voiding makes it worse. No gross hematuria. She took tamsulosin. Hasn't seen a stone pass. She has no constipation.   CT non-con was benign. Non-smoker. No chemical exposure. No chemo.     ALLERGIES: Hycodan TABS - Swelling, Itching Penicillin - Respiratory Distress, Trouble Breathing Tamsulosin - Itching    MEDICATIONS: Vitamin B12     GU PSH: None   NON-GU PSH: Reconstruct Cranial Bone - 2016     GU PMH: Abdominal Pain Unspec - 09/10/2019 Microscopic hematuria, rec to rechec UA again and cystoscopy and exam. Discussed radiology will read the CT officially and I will let her know. - 09/10/2019    NON-GU PMH: None   FAMILY HISTORY: Blood In Urine - Mother Breast Cancer - Aunt father deceased - Father mother living - Mother No  Children - Other   SOCIAL HISTORY: Marital Status: Married Current Smoking Status: Patient has never smoked.   Tobacco Use Assessment Completed: Used Tobacco in last 30 days? Drinks 5 drinks per day.  Drinks 1 caffeinated drink per day. Patient's occupation Actuary.    REVIEW OF SYSTEMS:    GU Review Female:   vaginal pain, pain at the end of urine stream. Patient reports have to strain to urinate. Patient denies frequent urination, hard to postpone urination, burning /pain with urination, get up at night to urinate, leakage of urine, stream starts and stops, trouble starting your stream, and being pregnant.  Gastrointestinal (Upper):   Patient denies nausea, vomiting, and indigestion/ heartburn.  Gastrointestinal (Lower):   Patient denies diarrhea and constipation.  Constitutional:   Patient denies fever, night sweats, weight loss, and fatigue.  Skin:   Patient denies skin rash/ lesion and itching.  Eyes:   Patient denies blurred vision and double vision.  Ears/ Nose/ Throat:   Patient denies sore throat and sinus problems.  Hematologic/Lymphatic:   Patient denies swollen glands and easy bruising.  Cardiovascular:   Patient denies leg swelling and chest pains.  Respiratory:   Patient denies cough and shortness of breath.  Endocrine:   Patient denies excessive thirst.  Musculoskeletal:   Patient denies back pain and joint pain.  Neurological:   Patient denies headaches and dizziness.  Psychologic:   Patient denies depression and anxiety.   VITAL SIGNS:      09/22/2019 03:03 PM  BP 159/78 mmHg  Pulse 80 /min  Temperature 98.4  F / 36.8 C   GU PHYSICAL EXAMINATION:    External Genitalia: No hirsutism, no rash, no scarring, no cyst, no erythematous lesion, no papular lesion, no blanched lesion, no warty lesion. No edema.  Urethral Meatus: Normal size. Normal position. No discharge.  Urethra: No tenderness, no mass, no scarring. No hypermobility. No leakage.  Bladder:  Normal to palpation, no tenderness, no mass, normal size.  Vagina: No atrophy, no stenosis. No rectocele. No cystocele. No enterocele. Pelvic floor non-tender  Cervix: No tenderness.    MULTI-SYSTEM PHYSICAL EXAMINATION:    Constitutional: Well-nourished. No physical deformities. Normally developed. Good grooming.  Neck: Neck symmetrical, not swollen. Normal tracheal position.  Respiratory: No labored breathing, no use of accessory muscles.   Cardiovascular: Normal temperature, normal extremity pulses, no swelling, no varicosities.  Neurologic / Psychiatric: Oriented to time, oriented to place, oriented to person. No depression, no anxiety, no agitation.  Gastrointestinal: No mass, no tenderness, no rigidity, non obese abdomen.     PAST DATA REVIEWED:  Source Of History:  Patient  X-Ray Review: C.T. Abdomen/Pelvis: Reviewed Films. 09/10/2019    PROCEDURES:         Flexible Cystoscopy - 52000  Risks, benefits, and some of the potential complications of the procedure were discussed at length with the patient including infection, bleeding, voiding discomfort, urinary retention, fever, chills, sepsis, and others. All questions were answered. Informed consent was obtained. Antibiotic prophylaxis was given. Sterile technique and intraurethral analgesia were used. Chaperone - Lauren - for exam and cystoscopy.   Meatus:  Normal size. Normal location. Normal condition.  Urethra:  No hypermobility. No leakage.  Ureteral Orifices:  Normal location. Normal size. Normal shape. Effluxed clear urine.  Bladder:  Erythematous mucosa - right posterior toward dome. No trabeculation. No tumors. No stones.      The lower urinary tract was carefully examined. The procedure was well-tolerated and without complications. Antibiotic instructions were given. Instructions were given to call the office immediately for bloody urine, difficulty urinating, urinary retention, painful or frequent urination, fever, chills,  nausea, vomiting or other illness. The patient stated that she understood these instructions and would comply with them.         Urinalysis w/Scope Dipstick Dipstick Cont'd Micro  Color: Yellow Bilirubin: Neg mg/dL WBC/hpf: NS (Not Seen)  Appearance: Clear Ketones: Trace mg/dL RBC/hpf: 3 - 39/JQB  Specific Gravity: 1.025 Blood: 1+ ery/uL Bacteria: NS (Not Seen)  pH: 6.0 Protein: Trace mg/dL Cystals: NS (Not Seen)  Glucose: Neg mg/dL Urobilinogen: 0.2 mg/dL Casts: NS (Not Seen)    Nitrites: Neg Trichomonas: Not Present    Leukocyte Esterase: Neg leu/uL Mucous: Not Present      Epithelial Cells: 0 - 5/hpf      Yeast: NS (Not Seen)      Sperm: Not Present    ASSESSMENT:      ICD-10 Details  1 GU:   Suprapubic pain - R39.82 Chronic, Worsening - possibly related to bladder erythema. Trial of urogesic.   2   Microscopic hematuria - R31.21 Chronic, Stable - will add RGP to cysto  3   Other Disorders Of Bladder - N32.89 Acute, Uncomplicated - erythema on cysto needs a biopsy. Discussed the reason for a biopsy and fulguration might help her pain and to make sure there is no malignancy.    PLAN:           Schedule Return Visit/Planned Activity: Next Available Appointment - Schedule Surgery  Document Letter(s):  Created for Patient: Clinical Summary         Notes:   cc: NP Carlis Abbott     * Signed by Festus Aloe, M.D. on 09/22/19 at 4:44 PM (EST)*     The information contained in this medical record document is considered private and confidential patient information. This information can only be used for the medical diagnosis and/or medical services that are being provided by the patient's selected caregivers. This information can only be distributed outside of the patient's care if the patient agrees and signs waivers of authorization for this information to be sent to an outside source or route.

## 2019-10-06 ENCOUNTER — Ambulatory Visit (HOSPITAL_BASED_OUTPATIENT_CLINIC_OR_DEPARTMENT_OTHER)
Admission: RE | Admit: 2019-10-06 | Discharge: 2019-10-06 | Disposition: A | Discharge status: Home / Self Care | Payer: BC Managed Care – PPO | Attending: Urology | Admitting: Urology

## 2019-10-06 ENCOUNTER — Encounter (HOSPITAL_BASED_OUTPATIENT_CLINIC_OR_DEPARTMENT_OTHER): Payer: Self-pay | Admitting: Urology

## 2019-10-06 ENCOUNTER — Ambulatory Visit (HOSPITAL_BASED_OUTPATIENT_CLINIC_OR_DEPARTMENT_OTHER): Payer: BC Managed Care – PPO | Admitting: Certified Registered Nurse Anesthetist

## 2019-10-06 ENCOUNTER — Encounter (HOSPITAL_BASED_OUTPATIENT_CLINIC_OR_DEPARTMENT_OTHER)
Admission: RE | Disposition: A | Discharge status: Home / Self Care | Payer: Self-pay | Source: Home / Self Care | Attending: Urology

## 2019-10-06 ENCOUNTER — Other Ambulatory Visit: Payer: Self-pay

## 2019-10-06 DIAGNOSIS — R300 Dysuria: Secondary | ICD-10-CM | POA: Diagnosis not present

## 2019-10-06 DIAGNOSIS — R1030 Lower abdominal pain, unspecified: Secondary | ICD-10-CM | POA: Diagnosis not present

## 2019-10-06 DIAGNOSIS — N3021 Other chronic cystitis with hematuria: Secondary | ICD-10-CM | POA: Insufficient documentation

## 2019-10-06 DIAGNOSIS — N3020 Other chronic cystitis without hematuria: Secondary | ICD-10-CM | POA: Diagnosis not present

## 2019-10-06 DIAGNOSIS — N3289 Other specified disorders of bladder: Secondary | ICD-10-CM | POA: Diagnosis not present

## 2019-10-06 DIAGNOSIS — F418 Other specified anxiety disorders: Secondary | ICD-10-CM | POA: Diagnosis not present

## 2019-10-06 DIAGNOSIS — R3915 Urgency of urination: Secondary | ICD-10-CM | POA: Diagnosis not present

## 2019-10-06 DIAGNOSIS — R3121 Asymptomatic microscopic hematuria: Secondary | ICD-10-CM | POA: Diagnosis not present

## 2019-10-06 DIAGNOSIS — R3129 Other microscopic hematuria: Secondary | ICD-10-CM | POA: Diagnosis not present

## 2019-10-06 HISTORY — PX: CYSTOSCOPY WITH BIOPSY: SHX5122

## 2019-10-06 HISTORY — DX: Pure hyperglyceridemia: E78.1

## 2019-10-06 HISTORY — DX: Vitamin D deficiency, unspecified: E55.9

## 2019-10-06 HISTORY — DX: Carpal tunnel syndrome, right upper limb: G56.01

## 2019-10-06 HISTORY — DX: Depression, unspecified: F32.A

## 2019-10-06 HISTORY — DX: Iron deficiency anemia, unspecified: D50.9

## 2019-10-06 HISTORY — DX: Anxiety disorder, unspecified: F41.9

## 2019-10-06 LAB — POCT PREGNANCY, URINE: Preg Test, Ur: NEGATIVE

## 2019-10-06 SURGERY — CYSTOSCOPY, WITH BIOPSY
Anesthesia: General

## 2019-10-06 MED ORDER — STERILE WATER FOR IRRIGATION IR SOLN
Status: DC | PRN
  Administered 2019-10-06: 3000 mL

## 2019-10-06 MED ORDER — MIDAZOLAM HCL 5 MG/5ML IJ SOLN
INTRAMUSCULAR | Status: DC | PRN
  Administered 2019-10-06: 2 mg via INTRAVENOUS

## 2019-10-06 MED ORDER — ONDANSETRON HCL 4 MG/2ML IJ SOLN
INTRAMUSCULAR | Status: DC | PRN
  Administered 2019-10-06: 4 mg via INTRAVENOUS

## 2019-10-06 MED ORDER — FENTANYL CITRATE (PF) 100 MCG/2ML IJ SOLN
25.0000 ug | INTRAMUSCULAR | Status: DC | PRN
  Filled 2019-10-06: qty 1

## 2019-10-06 MED ORDER — SODIUM CHLORIDE 0.9 % IV SOLN
INTRAVENOUS | Status: DC | PRN
  Administered 2019-10-06: 4 mL

## 2019-10-06 MED ORDER — ACETAMINOPHEN 500 MG PO TABS
ORAL_TABLET | ORAL | Status: AC
  Filled 2019-10-06: qty 2

## 2019-10-06 MED ORDER — MEPERIDINE HCL 25 MG/ML IJ SOLN
6.2500 mg | INTRAMUSCULAR | Status: DC | PRN
  Filled 2019-10-06: qty 1

## 2019-10-06 MED ORDER — LACTATED RINGERS IV SOLN
INTRAVENOUS | Status: DC
  Filled 2019-10-06: qty 1000

## 2019-10-06 MED ORDER — PHENAZOPYRIDINE HCL 200 MG PO TABS
ORAL | Status: DC | PRN
  Administered 2019-10-06: 15 mL via INTRAVESICAL

## 2019-10-06 MED ORDER — ONDANSETRON HCL 4 MG/2ML IJ SOLN
INTRAMUSCULAR | Status: AC
  Filled 2019-10-06: qty 2

## 2019-10-06 MED ORDER — DEXAMETHASONE SODIUM PHOSPHATE 10 MG/ML IJ SOLN
INTRAMUSCULAR | Status: AC
  Filled 2019-10-06: qty 1

## 2019-10-06 MED ORDER — OXYBUTYNIN CHLORIDE 5 MG PO TABS: 5.0000 mg | ORAL_TABLET | Freq: Three times a day (TID) | ORAL | 3 refills | Status: DC | PRN

## 2019-10-06 MED ORDER — FENTANYL CITRATE (PF) 100 MCG/2ML IJ SOLN
INTRAMUSCULAR | Status: AC
  Filled 2019-10-06: qty 2

## 2019-10-06 MED ORDER — OXYCODONE HCL 5 MG/5ML PO SOLN
5.0000 mg | Freq: Once | ORAL | Status: DC | PRN
  Filled 2019-10-06: qty 5

## 2019-10-06 MED ORDER — OXYCODONE HCL 5 MG PO TABS
5.0000 mg | ORAL_TABLET | Freq: Once | ORAL | Status: DC | PRN
  Filled 2019-10-06: qty 1

## 2019-10-06 MED ORDER — LIDOCAINE 2% (20 MG/ML) 5 ML SYRINGE
INTRAMUSCULAR | Status: DC | PRN
  Administered 2019-10-06: 60 mg via INTRAVENOUS

## 2019-10-06 MED ORDER — AZTREONAM 2 G IJ SOLR
1.0000 g | Freq: Once | INTRAMUSCULAR | Status: DC
  Filled 2019-10-06: qty 2

## 2019-10-06 MED ORDER — MIDAZOLAM HCL 2 MG/2ML IJ SOLN
INTRAMUSCULAR | Status: AC
  Filled 2019-10-06: qty 2

## 2019-10-06 MED ORDER — ACETAMINOPHEN 500 MG PO TABS
1000.0000 mg | ORAL_TABLET | Freq: Once | ORAL | Status: AC
  Administered 2019-10-06: 1000 mg via ORAL
  Filled 2019-10-06: qty 2

## 2019-10-06 MED ORDER — PHENAZOPYRIDINE HCL 100 MG PO TABS: 100.0000 mg | ORAL_TABLET | Freq: Three times a day (TID) | ORAL | 0 refills | Status: DC | PRN

## 2019-10-06 MED ORDER — DEXAMETHASONE SODIUM PHOSPHATE 4 MG/ML IJ SOLN
INTRAMUSCULAR | Status: DC | PRN
  Administered 2019-10-06: 10 mg via INTRAVENOUS

## 2019-10-06 MED ORDER — SODIUM CHLORIDE 0.9 % IV SOLN
1.0000 g | Freq: Once | INTRAVENOUS | Status: AC
  Administered 2019-10-06: 1 g via INTRAVENOUS
  Filled 2019-10-06: qty 1

## 2019-10-06 MED ORDER — PROMETHAZINE HCL 25 MG/ML IJ SOLN
6.2500 mg | INTRAMUSCULAR | Status: DC | PRN
  Filled 2019-10-06: qty 1

## 2019-10-06 MED ORDER — LIDOCAINE 2% (20 MG/ML) 5 ML SYRINGE
INTRAMUSCULAR | Status: AC
  Filled 2019-10-06: qty 5

## 2019-10-06 MED ORDER — PROPOFOL 10 MG/ML IV BOLUS
INTRAVENOUS | Status: DC | PRN
  Administered 2019-10-06: 200 mg via INTRAVENOUS

## 2019-10-06 MED ORDER — PROPOFOL 10 MG/ML IV BOLUS
INTRAVENOUS | Status: AC
  Filled 2019-10-06: qty 40

## 2019-10-06 MED ORDER — FENTANYL CITRATE (PF) 100 MCG/2ML IJ SOLN
INTRAMUSCULAR | Status: DC | PRN
  Administered 2019-10-06: 100 ug via INTRAVENOUS

## 2019-10-06 SURGICAL SUPPLY — 26 items
BAG DRAIN URO-CYSTO SKYTR STRL (DRAIN) ×3 IMPLANT
BAG DRN UROCATH (DRAIN) ×1
CATH FOLEY 2WAY SLVR  5CC 16FR (CATHETERS) ×2
CATH FOLEY 2WAY SLVR 5CC 16FR (CATHETERS) IMPLANT
CATH URET 5FR 28IN OPEN ENDED (CATHETERS) ×3 IMPLANT
CLOTH BEACON ORANGE TIMEOUT ST (SAFETY) ×3 IMPLANT
DRSG TELFA 3X8 NADH (GAUZE/BANDAGES/DRESSINGS) ×3 IMPLANT
ELECT REM PT RETURN 9FT ADLT (ELECTROSURGICAL) ×3
ELECTRODE REM PT RTRN 9FT ADLT (ELECTROSURGICAL) ×1 IMPLANT
GLOVE BIO SURGEON STRL SZ7.5 (GLOVE) ×3 IMPLANT
GLOVE BIOGEL PI IND STRL 7.0 (GLOVE) IMPLANT
GLOVE BIOGEL PI INDICATOR 7.0 (GLOVE) ×2
GLOVE SURG SS PI 7.5 STRL IVOR (GLOVE) ×2 IMPLANT
GOWN STRL REUS W/ TWL XL LVL3 (GOWN DISPOSABLE) ×1 IMPLANT
GOWN STRL REUS W/TWL LRG LVL3 (GOWN DISPOSABLE) ×4 IMPLANT
GOWN STRL REUS W/TWL XL LVL3 (GOWN DISPOSABLE) ×3
KIT TURNOVER CYSTO (KITS) ×3 IMPLANT
MANIFOLD NEPTUNE II (INSTRUMENTS) ×3 IMPLANT
NEEDLE HYPO 22GX1.5 SAFETY (NEEDLE) ×2 IMPLANT
NS IRRIG 500ML POUR BTL (IV SOLUTION) ×3 IMPLANT
PACK CYSTO (CUSTOM PROCEDURE TRAY) ×3 IMPLANT
PAD DRESSING TELFA 3X8 NADH (GAUZE/BANDAGES/DRESSINGS) IMPLANT
SYR 20ML LL LF (SYRINGE) ×2 IMPLANT
TUBE CONNECTING 12'X1/4 (SUCTIONS) ×1
TUBE CONNECTING 12X1/4 (SUCTIONS) ×1 IMPLANT
WATER STERILE IRR 3000ML UROMA (IV SOLUTION) ×3 IMPLANT

## 2019-10-06 NOTE — Op Note (Signed)
Preoperative diagnosis: Dysuria, urgency, bladder erythema, microscopic hematuria Postoperative diagnosis: Same  Procedure: Cystoscopy, bilateral retrograde pyelogram, bladder biopsy and fulguration 2 to 5 cm  Surgeon: Mena Goes  Anesthesia: General  Indication for procedure: Alyssa Baker is a 49 year old female who has been having dysuria and bladder pressure.  She underwent a noncontrast CT scan to rule out a stone as she also had microscopic hematuria.  This was benign.  Office cystoscopy revealed a red ulcerated appearing area right posterior bladder.  She is brought today for further endoscopic exam under anesthesia and biopsy.  Findings: On exam under anesthesia the vulva appeared normal, the bladder and urethra were palpably normal.  The cervix was somewhat low in the pelvis but palpably normal.  The cervix was not visualized.  The vagina appeared normal.  There was no prolapse.  On cystoscopy the urethra appeared normal.  The trigone and ureteral orifice ease appeared normal with clear reflux.  The bladder appeared normal apart from a erythematous patch of mucosa right posterior superior bladder. This area was friable and bleeding after the slightest bladder filling/distention.   Right retrograde pyelogram-this outlined a delicate system without filling defect, stricture or dilation.  There was brisk drainage.  Left retrograde pyelogram-this outlined a delicate system without filling defect, stricture or dilation.  There was brisk drainage.  Description of procedure: After consent was obtained patient brought to the operating room.  After adequate anesthesia she was placed lithotomy position and prepped and draped in usual sterile fashion.  A timeout was performed to confirm the patient and procedure.  An exam under anesthesia was performed.  The cystoscope was passed per urethra bladder carefully inspected with a 30 degree and 70 degree lens.  I then took a 5 [place] open-ended catheter and right  retrograde injection of contrast was performed.  The left ureteral orifice was then cannulated and left retrograde injection of contrast was performed.  I then took the Bugbee electrode to outline the area of bright erythema so that after the biopsy it could be fulgurated in its entirety.  I then biopsied the ulcerated area x3.  It was right superior and a little bit hard to get a good bite.  The biopsy sites were then fulgurated as well as the entire ulcerated area.  Hemostasis was excellent at low pressure and the scope removed.  Marcaine Pyridium mixture was instilled with a Foley catheter and the catheter removed.  She was awakened taken recovery room in stable condition.  Complications: None  Blood loss: Minimal  Specimens to pathology: Right superior bladder biopsy x3  Drains: None  Disposition: Patient stable to PACU

## 2019-10-06 NOTE — Transfer of Care (Signed)
Immediate Anesthesia Transfer of Care Note  Patient: Alyssa Baker  Procedure(s) Performed: CYSTOSCOPY WITH BLADDER BIOPSY/ FULGURATION 2-5 CM/ BILATERAL  RETROGRADE PYELOGRAM/ INSTILLATION OF MARCAINE/PYRIDIUM (N/A )  Patient Location: PACU  Anesthesia Type:General  Level of Consciousness: awake, alert , oriented and patient cooperative  Airway & Oxygen Therapy: Patient Spontanous Breathing and Patient connected to nasal cannula oxygen  Post-op Assessment: Report given to RN and Post -op Vital signs reviewed and stable  Post vital signs: Reviewed and stable  Last Vitals:  Vitals Value Taken Time  BP 148/81 10/06/19 1230  Temp    Pulse 78 10/06/19 1231  Resp 19 10/06/19 1231  SpO2 100 % 10/06/19 1231  Vitals shown include unvalidated device data.  Last Pain:  Vitals:   10/06/19 1109  TempSrc: Oral  PainSc: 0-No pain      Patients Stated Pain Goal: 5 (10/06/19 1109)  Complications: No apparent anesthesia complications

## 2019-10-06 NOTE — Anesthesia Preprocedure Evaluation (Signed)
Anesthesia Evaluation  Patient identified by MRN, date of birth, ID band Patient awake    Reviewed: Allergy & Precautions, NPO status , Patient's Chart, lab work & pertinent test results  Airway Mallampati: II  TM Distance: >3 FB Neck ROM: Full    Dental no notable dental hx.    Pulmonary neg pulmonary ROS,    Pulmonary exam normal breath sounds clear to auscultation       Cardiovascular negative cardio ROS Normal cardiovascular exam Rhythm:Regular Rate:Normal     Neuro/Psych PSYCHIATRIC DISORDERS Anxiety Depression negative neurological ROS     GI/Hepatic negative GI ROS, Neg liver ROS,   Endo/Other  negative endocrine ROS  Renal/GU negative Renal ROS Bladder dysfunction  Microscopic hematuria, bladder erythema    Musculoskeletal negative musculoskeletal ROS (+)   Abdominal   Peds negative pediatric ROS (+)  Hematology  (+) anemia , Fe deficiency anemia   Anesthesia Other Findings HLD  Reproductive/Obstetrics negative OB ROS                             Anesthesia Physical Anesthesia Plan  ASA: II  Anesthesia Plan: General   Post-op Pain Management:    Induction: Intravenous  PONV Risk Score and Plan: 4 or greater and Ondansetron, Dexamethasone, Midazolam and Treatment may vary due to age or medical condition  Airway Management Planned: LMA  Additional Equipment: None  Intra-op Plan:   Post-operative Plan: Extubation in OR  Informed Consent: I have reviewed the patients History and Physical, chart, labs and discussed the procedure including the risks, benefits and alternatives for the proposed anesthesia with the patient or authorized representative who has indicated his/her understanding and acceptance.     Dental advisory given  Plan Discussed with: CRNA  Anesthesia Plan Comments:         Anesthesia Quick Evaluation

## 2019-10-06 NOTE — Interval H&P Note (Signed)
History and Physical Interval Note:  10/06/2019 11:39 AM  Alyssa Baker  has presented today for surgery, with the diagnosis of MICROSCOPIC HEMATURIA, BLADDER ERYTHEMA.  The various methods of treatment have been discussed with the patient and family. After consideration of risks, benefits and other options for treatment, the patient has consented to  Procedure(s): CYSTOSCOPY WITH BLADDER BIOPSY/ FULGURATION/ RETROGRADE PYELOGRAM/ INSTILLATION OF MARCAINE/PYRIDIUM (N/A) as a surgical intervention.  The patient's history has been reviewed, patient examined, no change in status, stable for surgery. She continues to have dysuria and bladder "spasm" - SP pressure.  I have reviewed the patient's chart and labs.  Questions were answered to the patient's satisfaction.  She elects to proceed. Otherwise, she says her urine is clear and no fever.    Jerilee Field

## 2019-10-06 NOTE — Anesthesia Procedure Notes (Signed)
Procedure Name: LMA Insertion Date/Time: 10/06/2019 11:43 AM Performed by: Vanessa Florissant, CRNA Pre-anesthesia Checklist: Emergency Drugs available, Patient identified, Suction available and Patient being monitored Patient Re-evaluated:Patient Re-evaluated prior to induction Oxygen Delivery Method: Circle system utilized Preoxygenation: Pre-oxygenation with 100% oxygen Induction Type: IV induction Ventilation: Mask ventilation without difficulty LMA: LMA inserted LMA Size: 4.0 Number of attempts: 1 Placement Confirmation: positive ETCO2 and breath sounds checked- equal and bilateral Tube secured with: Tape Dental Injury: Teeth and Oropharynx as per pre-operative assessment

## 2019-10-06 NOTE — Discharge Instructions (Signed)
Bladder Biopsy, Care After This sheet gives you information about how to care for yourself after your procedure. Your health care provider may also give you more specific instructions. If you have problems or questions, contact your health care provider. What can I expect after the procedure? After the procedure, it is common to have:  Mild pain in your bladder or kidney area during urination.  Minor burning during urination.  Small amounts of blood in your urine.  A sudden urge to urinate.  A need to urinate more often than usual. Follow these instructions at home: Medicines  Take over-the-counter and prescription medicines only as told by your health care provider.  If you were prescribed an antibiotic medicine, take it as told by your health care provider. Do not stop taking the antibiotic even if you start to feel better. Activity  Rest if told by your health care provider.  Do not drive for 24 hours if you received a medicine to help you relax (sedative) during your procedure. Ask your health care provider when it is safe for you to drive.  Return to your normal activities as told by your health care provider. Ask your health care provider what activities are safe for you. General instructions   Take a warm bath to relieve any burning sensations around your urethra.  Hold a warm, damp washcloth over the urethral area to ease pain.  It is up to you to get the results of your procedure. Ask your health care provider, or the department that is doing the procedure, when your results will be ready.  Keep all follow-up visits as told by your health care provider. This is important. Contact a health care provider if:  You have a fever.  Your symptoms do not improve within 24 hours, and you continue to have: ? Burning during urination. ? Increasing amounts of blood in your urine. ? Pain during urination. ? An urgent need to urinate. ? A need to urinate more often than  usual. Get help right away if:  You have a lot of bleeding or more bleeding.  You have severe pain.  You are unable to urinate.  You have bright red blood in your urine.  You are passing blood clots in your urine.  You have a fever. Summary  After the procedure, it is common to have mild pain, burning with urination, and some blood.  Take medicines as told. If you were given antibiotics, finish all of it even if you start to feel better.  Rest after the procedure. Follow your health care provider's instructions for self care at home.  Contact a health care provider if your symptoms do not improve within 24 hours, or if you have more pain or more blood in your urine.  Get help right away if you have a lot of bleeding, severe pain, fever, or bright red blood or blood clots in the urine. This information is not intended to replace advice given to you by your health care provider. Make sure you discuss any questions you have with your health care provider. Document Revised: 02/18/2019 Document Reviewed: 02/18/2019 Elsevier Patient Education  2020 [employer].   Post Anesthesia Home Care Instructions  Activity: Get plenty of rest for the remainder of the day. A responsible individual must stay with you for 24 hours following the procedure.  For the next 24 hours, DO NOT: -Drive a car -Advertising copywriter -Drink alcoholic beverages -Take any medication unless instructed by your physician -Make any  legal decisions or sign important papers.  Meals: Start with liquid foods such as gelatin or soup. Progress to regular foods as tolerated. Avoid greasy, spicy, heavy foods. If nausea and/or vomiting occur, drink only clear liquids until the nausea and/or vomiting subsides. Call your physician if vomiting continues.  Special Instructions/Symptoms: Your throat may feel dry or sore from the anesthesia or the breathing tube placed in your throat during surgery. If this causes discomfort,  gargle with warm salt water. The discomfort should disappear within 24 hours.

## 2019-10-06 NOTE — Anesthesia Postprocedure Evaluation (Signed)
Anesthesia Post Note  Patient: Alyssa Baker  Procedure(s) Performed: CYSTOSCOPY WITH BLADDER BIOPSY/ FULGURATION 2-5 CM/ BILATERAL  RETROGRADE PYELOGRAM/ INSTILLATION OF MARCAINE/PYRIDIUM (N/A )     Patient location during evaluation: PACU Anesthesia Type: General Level of consciousness: awake and alert, oriented and patient cooperative Pain management: pain level controlled Vital Signs Assessment: post-procedure vital signs reviewed and stable Respiratory status: spontaneous breathing, nonlabored ventilation and respiratory function stable Cardiovascular status: blood pressure returned to baseline and stable Postop Assessment: no apparent nausea or vomiting Anesthetic complications: no    Last Vitals:  Vitals:   10/06/19 1109 10/06/19 1230  BP: (!) 153/88 (!) 148/81  Pulse: 73 84  Resp: 16 16  Temp: 36.7 C 36.6 C  SpO2: 100% 100%    Last Pain:  Vitals:   10/06/19 1230  TempSrc:   PainSc: 0-No pain                 Tennis Must Xia Stohr

## 2019-10-09 LAB — SURGICAL PATHOLOGY

## 2019-10-28 DIAGNOSIS — N390 Urinary tract infection, site not specified: Secondary | ICD-10-CM | POA: Diagnosis not present

## 2019-10-28 DIAGNOSIS — R109 Unspecified abdominal pain: Secondary | ICD-10-CM | POA: Diagnosis not present

## 2019-10-28 DIAGNOSIS — R3121 Asymptomatic microscopic hematuria: Secondary | ICD-10-CM | POA: Diagnosis not present

## 2019-10-28 DIAGNOSIS — B954 Other streptococcus as the cause of diseases classified elsewhere: Secondary | ICD-10-CM | POA: Diagnosis not present

## 2019-11-04 IMAGING — MG DIGITAL DIAGNOSTIC UNILATERAL LEFT MAMMOGRAM WITH TOMO AND CAD
4 series · 4 of 12 positions shown · non-contrast
Comparison: Previous exam(s).

CLINICAL DATA: 47-year-old female recalled from screening mammogram
dated 01/12/2019 for possible left breast asymmetry.

EXAM:
DIGITAL DIAGNOSTIC UNILATERAL LEFT MAMMOGRAM WITH CAD AND TOMO

[L CC synth-2D]
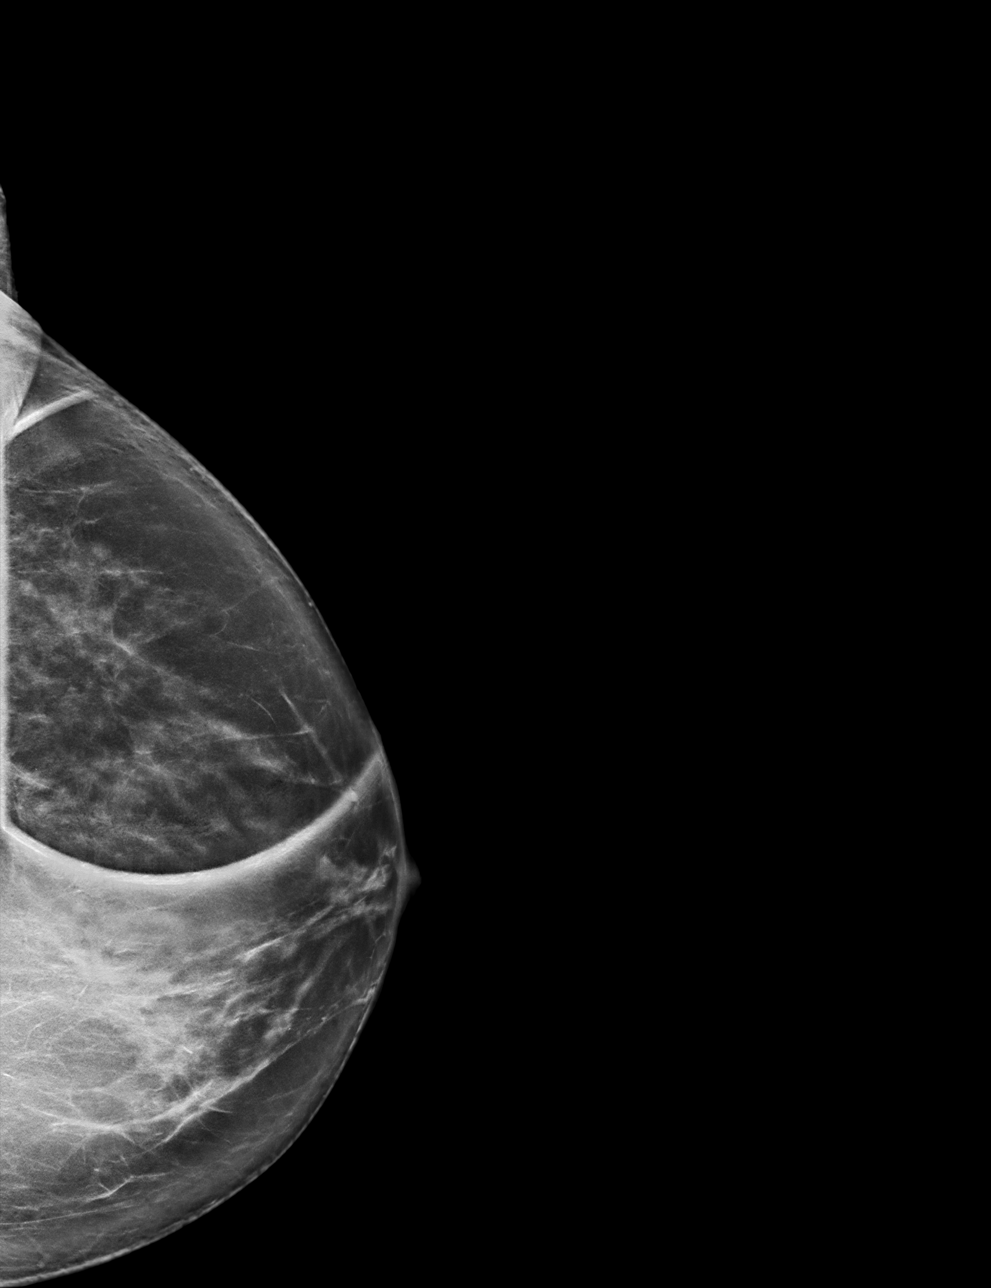

[L MLO synth-2D]
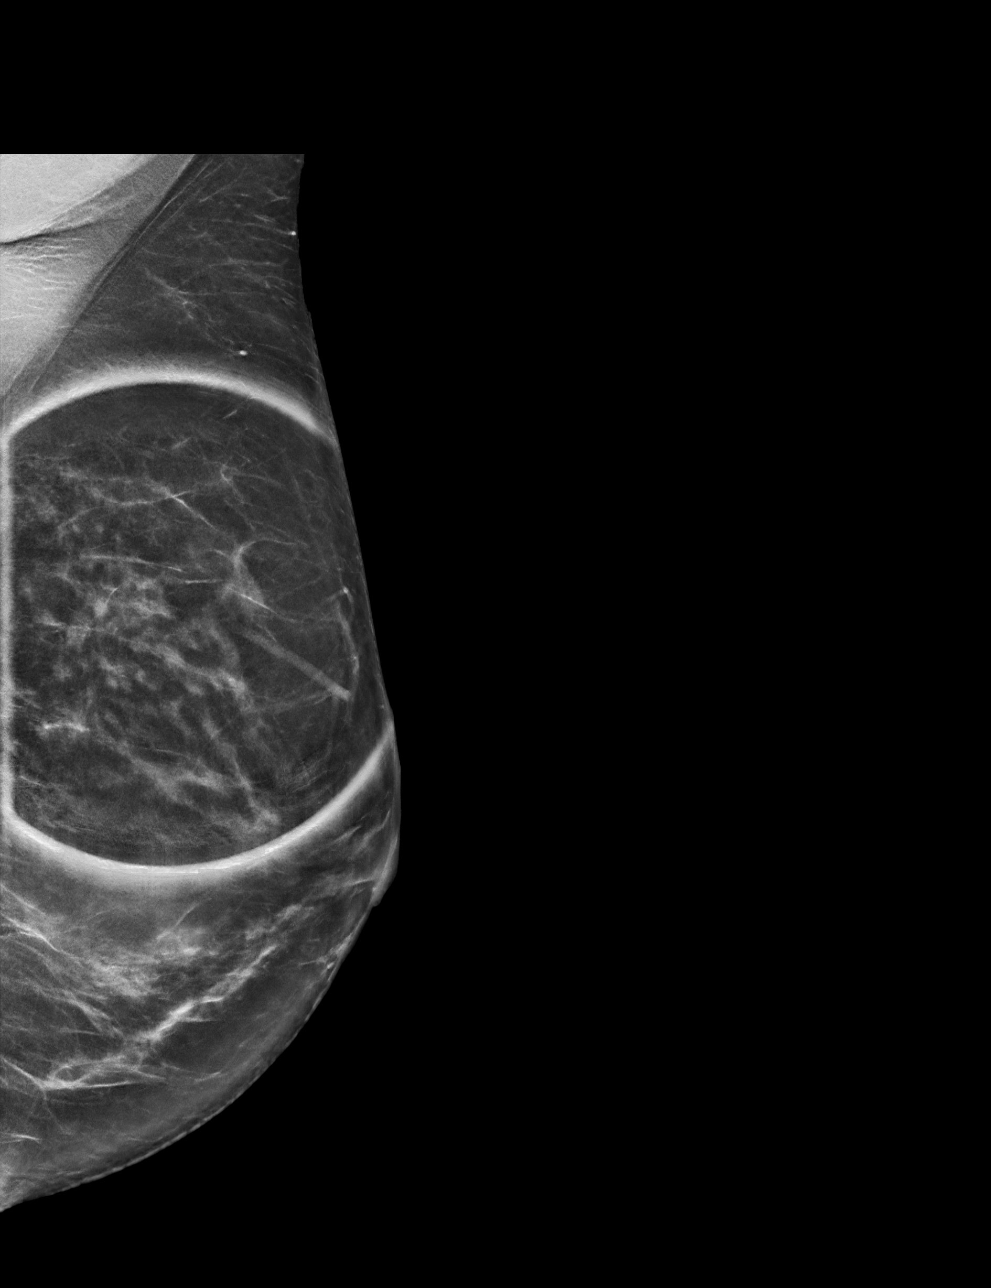

[L MLO tomo · tomo slice 35/69.0]
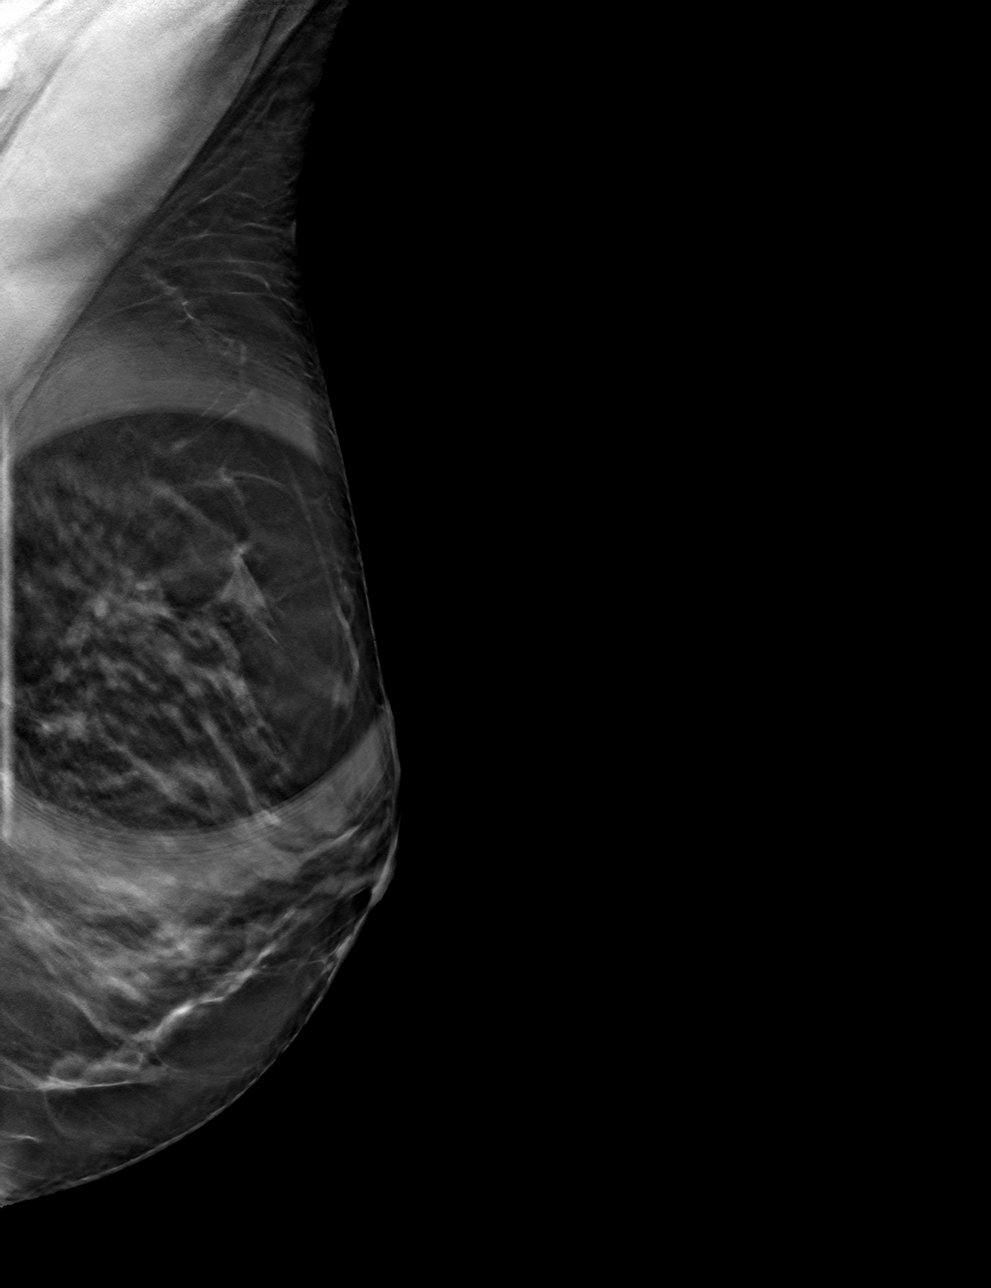

[L CC tomo · tomo slice 37/73.0]
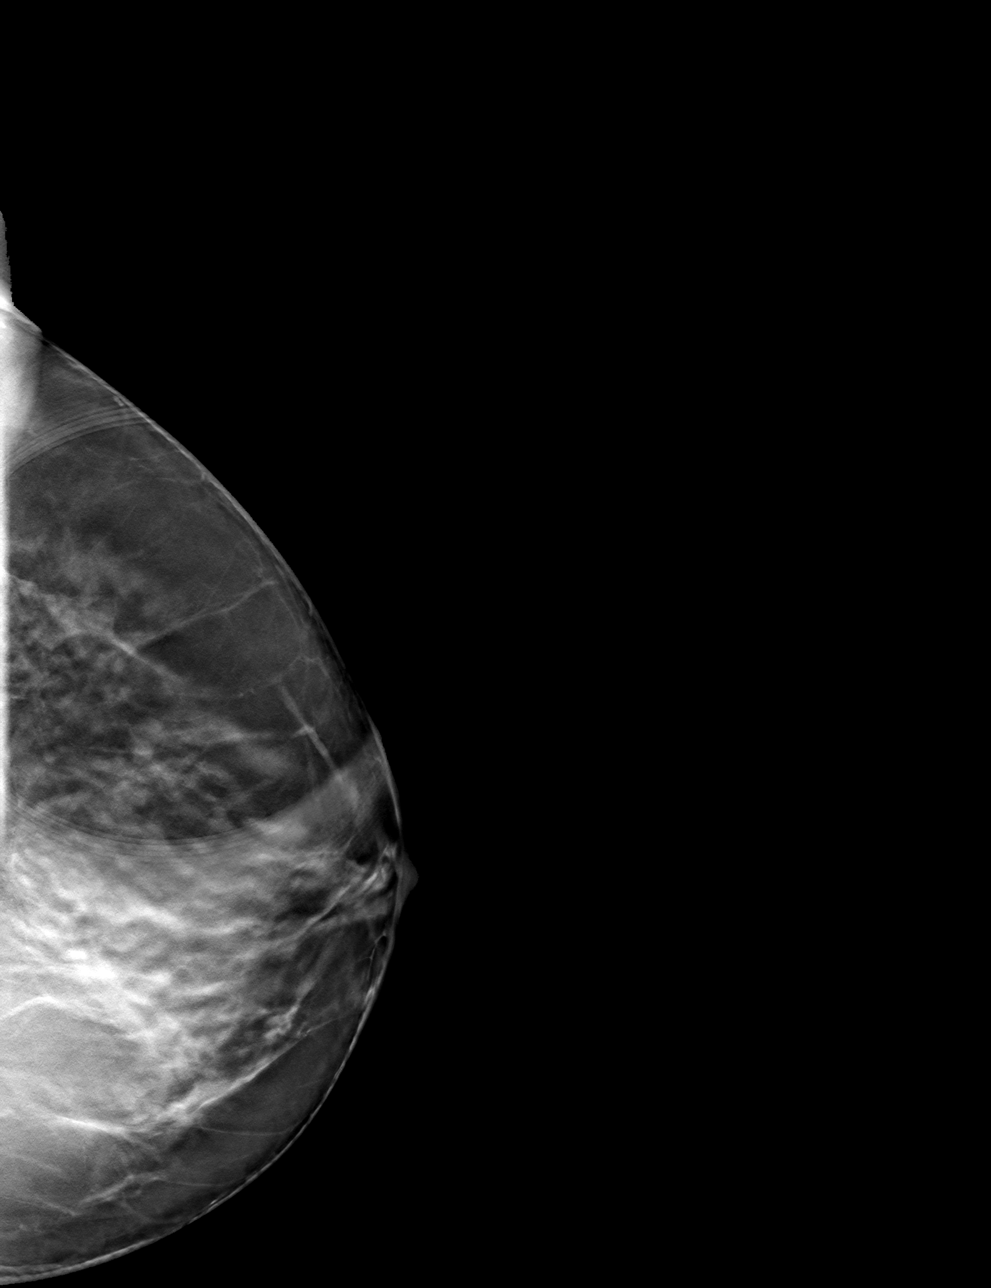

[4 of 12 positions shown; findings below may reference images not displayed]

ACR Breast Density Category c: The breast tissue is heterogeneously
dense, which may obscure small masses.
FINDINGS: Previously described, possible asymmetry in the upper outer left
breast at middle depth resolves into well dispersed fibroglandular
tissue on today's additional views. No suspicious findings are
identified.

Mammographic images were processed with CAD.
IMPRESSION: No mammographic evidence of malignancy.

RECOMMENDATION:
Screening mammogram in one year.(Code:DN-A-18G)

I have discussed the findings and recommendations with the patient.
Results were also provided in writing at the conclusion of the
visit. If applicable, a reminder letter will be sent to the patient
regarding the next appointment.

BI-RADS CATEGORY  1: Negative.

## 2019-11-19 ENCOUNTER — Telehealth: Payer: BC Managed Care – PPO | Admitting: Emergency Medicine

## 2019-11-19 DIAGNOSIS — R197 Diarrhea, unspecified: Secondary | ICD-10-CM | POA: Diagnosis not present

## 2019-11-19 DIAGNOSIS — R112 Nausea with vomiting, unspecified: Secondary | ICD-10-CM

## 2019-11-19 MED ORDER — ONDANSETRON 4 MG PO TBDP: 4.0000 mg | ORAL_TABLET | Freq: Three times a day (TID) | ORAL | 0 refills | Status: DC | PRN

## 2019-11-19 NOTE — Progress Notes (Signed)
We are sorry that you are not feeling well. Here is how we plan to help!  Based on what you have shared with me it looks like you have a Virus that is irritating your GI tract.  Vomiting is the forceful emptying of a portion of the stomach's content through the mouth.  Although nausea and vomiting can make you feel miserable, it's important to remember that these are not diseases, but rather symptoms of an underlying illness.  When we treat short term symptoms, we always caution that any symptoms that persist should be fully evaluated in a medical office.  With your other generalized body aches, cough, nausea, and diarrhea, it is thought that your symptoms are all viral.  Your symptoms don't sound consistent with a bacterial infection, therefore no antibiotic is indicated.  I will prescribe you something to help with nausea and vomiting.  It is important to stay hydrated.  I recommend mixing 1 part water to 1 part gatorade.  We don't usually prescribe treatment for diarrhea unless it lasts longer than 2 weeks.  If you spike a temperature or have worsening symptoms, you should be seen in person.  You should start to improve in the next few days.  If you do not, you should be seen in person.  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  I have prescribed a medication that will help alleviate your symptoms and allow you to stay hydrated:  Zofran 4 mg 1 tablet every 8 hours as needed for nausea and vomiting  HOME CARE:  Drink clear liquids.  This is very important! Dehydration (the lack of fluid) can lead to a serious complication.  Start off with 1 tablespoon every 5 minutes for 8 hours.  You may begin eating bland foods after 8 hours without vomiting.  Start with saltine crackers, white bread, rice, mashed potatoes, applesauce.  After 48 hours on a bland diet, you may resume a normal diet.  Try to go to sleep.  Sleep often empties the stomach and relieves the need to vomit.  GET HELP RIGHT AWAY  IF:   Your symptoms do not improve or worsen within 2 days after treatment.  You have a fever for over 3 days.  You cannot keep down fluids after trying the medication.  MAKE SURE YOU:   Understand these instructions.  Will watch your condition.  Will get help right away if you are not doing well or get worse.   Thank you for choosing an e-visit. Your e-visit answers were reviewed by a board certified advanced clinical practitioner to complete your personal care plan. Depending upon the condition, your plan could have included both over the counter or prescription medications. Please review your pharmacy choice. Be sure that the pharmacy you have chosen is open so that you can pick up your prescription now.  If there is a problem you may message your provider in [place] to have the prescription routed to another pharmacy. Your safety is important to [place]. If you have drug allergies check your prescription carefully.  For the next 24 hours, you can use MyChart to ask questions about today's visit, request a non-urgent call back, or ask for a work or school excuse from your e-visit provider. You will get an e-mail in the next two days asking about your experience. I hope that your e-visit has been valuable and will speed your recovery.   Greater than 5 minutes, yet less than 10 minutes was used in reviewing the patient's  chart, questionnaire, prescribing medications, and documentation for this visit.

## 2020-02-24 DIAGNOSIS — Z130 Encounter for screening for diseases of the blood and blood-forming organs and certain disorders involving the immune mechanism: Secondary | ICD-10-CM | POA: Diagnosis not present

## 2020-02-24 DIAGNOSIS — Z01419 Encounter for gynecological examination (general) (routine) without abnormal findings: Secondary | ICD-10-CM | POA: Diagnosis not present

## 2020-02-24 DIAGNOSIS — Z1231 Encounter for screening mammogram for malignant neoplasm of breast: Secondary | ICD-10-CM | POA: Diagnosis not present

## 2020-02-24 DIAGNOSIS — Z6823 Body mass index (BMI) 23.0-23.9, adult: Secondary | ICD-10-CM | POA: Diagnosis not present

## 2020-02-24 DIAGNOSIS — Z1389 Encounter for screening for other disorder: Secondary | ICD-10-CM | POA: Diagnosis not present

## 2020-05-11 ENCOUNTER — Ambulatory Visit
Admission: EM | Admit: 2020-05-11 | Discharge: 2020-05-11 | Disposition: A | Discharge status: Home / Self Care | Payer: BC Managed Care – PPO | Attending: Emergency Medicine | Admitting: Emergency Medicine

## 2020-05-11 ENCOUNTER — Other Ambulatory Visit: Payer: Self-pay

## 2020-05-11 DIAGNOSIS — Z20822 Contact with and (suspected) exposure to covid-19: Secondary | ICD-10-CM

## 2020-05-11 NOTE — ED Triage Notes (Signed)
Started having stomach issues on Saturday, wants to be covid tested.  Does not want to be seen by provider.

## 2020-05-14 LAB — NOVEL CORONAVIRUS, NAA: SARS-CoV-2, NAA: NOT DETECTED

## 2020-05-14 LAB — SARS-COV-2, NAA 2 DAY TAT

## 2020-05-23 ENCOUNTER — Ambulatory Visit
Admission: EM | Admit: 2020-05-23 | Discharge: 2020-05-23 | Disposition: A | Discharge status: Home / Self Care | Payer: BC Managed Care – PPO | Attending: Physician Assistant | Admitting: Physician Assistant

## 2020-05-23 ENCOUNTER — Other Ambulatory Visit: Payer: Self-pay

## 2020-05-23 DIAGNOSIS — R0781 Pleurodynia: Secondary | ICD-10-CM

## 2020-05-23 DIAGNOSIS — W19XXXA Unspecified fall, initial encounter: Secondary | ICD-10-CM

## 2020-05-23 MED ORDER — KETOROLAC TROMETHAMINE 15 MG/ML IJ SOLN
15.0000 mg | Freq: Once | INTRAMUSCULAR | Status: AC
  Administered 2020-05-23: 15 mg via INTRAMUSCULAR

## 2020-05-23 MED ORDER — TIZANIDINE HCL 2 MG PO TABS: 2.0000 mg | ORAL_TABLET | Freq: Three times a day (TID) | ORAL | 0 refills | Status: DC | PRN

## 2020-05-23 MED ORDER — DICLOFENAC SODIUM 50 MG PO TBEC: 50.0000 mg | DELAYED_RELEASE_TABLET | Freq: Two times a day (BID) | ORAL | 0 refills | Status: DC

## 2020-05-23 NOTE — ED Triage Notes (Signed)
Pt states had a fall on 9/17. States ?passed out. Pt states had a cut to her chin that she used liquid Band-Aid on. C/o rt rib/chest pain on inspiration.
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
## 2020-05-23 NOTE — Discharge Instructions (Signed)
Toradol injection in office today. Start diclofenac as directed. Tizanidine as needed, this can make you drowsy, so do not take if you are going to drive, operate heavy machinery, or make important decisions. Ice/heat compresses as needed. Please follow up with PCP for recheck and further evaluation for possible passing out from fall. If having shortness of breath, further passing out episodes, confusion, go to the emergency department for further evaluation.

## 2021-05-29 ENCOUNTER — Other Ambulatory Visit: Payer: Self-pay

## 2021-05-29 ENCOUNTER — Ambulatory Visit
Admission: RE | Admit: 2021-05-29 | Discharge: 2021-05-29 | Disposition: A | Discharge status: Home / Self Care | Payer: BC Managed Care – PPO | Source: Ambulatory Visit | Attending: Physician Assistant | Admitting: Physician Assistant

## 2021-05-29 VITALS — BP 146/92 | HR 80 | Temp 98.7°F | Resp 18 | Ht 68.0 in | Wt 157.0 lb

## 2021-05-29 DIAGNOSIS — L249 Irritant contact dermatitis, unspecified cause: Secondary | ICD-10-CM

## 2021-05-29 MED ORDER — PREDNISONE 10 MG (21) PO TBPK: ORAL_TABLET | Freq: Every day | ORAL | 0 refills | Status: DC
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
## 2021-05-29 NOTE — ED Triage Notes (Signed)
Patient c/o rash on chest and back for a couple of months.  No new soaps, lotions or detergents.  The rash is itching.

## 2021-05-29 NOTE — Discharge Instructions (Addendum)
Take medication as prescribed Can take allergy medication like claritin or zyrtec.  If no improvement follow up with primary care physician or dermatology

## 2021-12-08 DIAGNOSIS — L918 Other hypertrophic disorders of the skin: Secondary | ICD-10-CM | POA: Diagnosis not present

## 2021-12-08 DIAGNOSIS — L308 Other specified dermatitis: Secondary | ICD-10-CM | POA: Diagnosis not present

## 2021-12-08 DIAGNOSIS — L650 Telogen effluvium: Secondary | ICD-10-CM | POA: Diagnosis not present

## 2021-12-08 DIAGNOSIS — L648 Other androgenic alopecia: Secondary | ICD-10-CM | POA: Diagnosis not present

## 2021-12-15 DIAGNOSIS — L918 Other hypertrophic disorders of the skin: Secondary | ICD-10-CM | POA: Diagnosis not present

## 2022-09-24 ENCOUNTER — Ambulatory Visit
Admission: EM | Admit: 2022-09-24 | Discharge: 2022-09-24 | Disposition: A | Discharge status: Home / Self Care | Payer: BC Managed Care – PPO | Attending: Urgent Care | Admitting: Urgent Care

## 2022-09-24 DIAGNOSIS — B353 Tinea pedis: Secondary | ICD-10-CM | POA: Diagnosis not present

## 2022-09-24 MED ORDER — FLUCONAZOLE 150 MG PO TABS: 150.0000 mg | ORAL_TABLET | ORAL | 1 refills | Status: DC
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
## 2022-09-24 NOTE — Discharge Instructions (Signed)
[facility] ([place]) Podiatrist in [place], [place] COVID-19 info: [email or web address] Get online care: [email or web address] Address: [address], [place], [place] [address] Phone: [phone] Appointments: [email or web address]   [facility], [place], [place] Doctor in [place],  Address: [address], [place], [place] [address] Phone: [phone]  

## 2022-09-24 NOTE — ED Triage Notes (Signed)
Pt c/o bilat foot rash x 2-3 months-no change with OTC meds-NAD-steady gait

## 2022-10-03 ENCOUNTER — Telehealth: Payer: BC Managed Care – PPO | Admitting: Emergency Medicine

## 2022-10-03 DIAGNOSIS — J069 Acute upper respiratory infection, unspecified: Secondary | ICD-10-CM

## 2022-10-03 MED ORDER — BENZONATATE 100 MG PO CAPS: 100.0000 mg | ORAL_CAPSULE | Freq: Two times a day (BID) | ORAL | 0 refills | Status: DC | PRN

## 2022-10-03 MED ORDER — IPRATROPIUM BROMIDE 0.03 % NA SOLN: 2.0000 | Freq: Two times a day (BID) | NASAL | 0 refills | Status: DC

## 2022-10-03 NOTE — Patient Instructions (Signed)
Alyssa Baker, thank you for joining Montine Circle, PA-C for today's virtual visit.  While this provider is not your primary care provider (PCP), if your PCP is located in our provider database this encounter information will be shared with them immediately following your visit.   [facility] account gives you access to today's visit and all your visits, tests, and labs performed at [facility] " click here if you don't have a [facility] account or go to [email or web address]  Consent: (Patient) Alyssa Baker provided verbal consent for this virtual visit at the beginning of the encounter.  Current Medications:  Current Outpatient Medications:    benzonatate (TESSALON) 100 MG capsule, Take 1 capsule (100 mg total) by mouth 2 (two) times daily as needed for cough., Disp: 20 capsule, Rfl: 0   ipratropium (ATROVENT) 0.03 % nasal spray, Place 2 sprays into both nostrils every 12 (twelve) hours., Disp: 30 mL, Rfl: 0   diclofenac (VOLTAREN) 50 MG EC tablet, Take 1 tablet (50 mg total) by mouth 2 (two) times daily., Disp: 20 tablet, Rfl: 0   fluconazole (DIFLUCAN) 150 MG tablet, Take 1 tablet (150 mg total) by mouth once a week., Disp: 6 tablet, Rfl: 1   predniSONE (STERAPRED UNI-PAK 21 TAB) 10 MG (21) TBPK tablet, Take by mouth daily. Take 6 tabs by mouth daily  for 2 days, then 5 tabs for 2 days, then 4 tabs for 2 days, then 3 tabs for 2 days, 2 tabs for 2 days, then 1 tab by mouth daily for 2 days, Disp: 42 tablet, Rfl: 0   tiZANidine (ZANAFLEX) 2 MG tablet, Take 1 tablet (2 mg total) by mouth every 8 (eight) hours as needed for muscle spasms., Disp: 15 tablet, Rfl: 0   Medications ordered in this encounter:  Meds ordered this encounter  Medications   benzonatate (TESSALON) 100 MG capsule    Sig: Take 1 capsule (100 mg total) by mouth 2 (two) times daily as needed for cough.    Dispense:  20 capsule    Refill:  0    Order Specific Question:   Supervising  Provider    Answer:   Chase Picket [[id number]]   ipratropium (ATROVENT) 0.03 % nasal spray    Sig: Place 2 sprays into both nostrils every 12 (twelve) hours.    Dispense:  30 mL    Refill:  0    Order Specific Question:   Supervising Provider    Answer:   Chase Picket [[id number]]     *If you need refills on other medications prior to your next appointment, please contact your pharmacy*  Follow-Up: Call back or seek an in-person evaluation if the symptoms worsen or if the condition fails to improve as anticipated.  [place] [phone]  Other Instructions    If you have been instructed to have an in-person evaluation today at a local Urgent Care facility, please use the link below. It will take you to a list of all of our available [facility], including address, phone number and hours of operation. Please do not delay care.  [facility]  If you or a family member do not have a primary care provider, use the link below to schedule a visit and establish care. When you choose a [facility] primary care physician or advanced practice provider, you gain a long-term partner in health. Find a Primary Care Provider  Learn more about 's in-office  and virtual care options: [facility]

## 2022-10-03 NOTE — Progress Notes (Signed)
Virtual Visit Consent   Jaylah Goodlow, you are scheduled for a virtual visit with a [facility] provider today. Just as with appointments in the office, your consent must be obtained to participate. Your consent will be active for this visit and any virtual visit you may have with one of our providers in the next 365 days. If you have a MyChart account, a copy of this consent can be sent to you electronically.  As this is a virtual visit, video technology does not allow for your provider to perform a traditional examination. This may limit your provider's ability to fully assess your condition. If your provider identifies any concerns that need to be evaluated in person or the need to arrange testing (such as labs, EKG, etc.), we will make arrangements to do so. Although advances in technology are sophisticated, we cannot ensure that it will always work on either your end or our end. If the connection with a video visit is poor, the visit may have to be switched to a telephone visit. With either a video or telephone visit, we are not always able to ensure that we have a secure connection.  By engaging in this virtual visit, you consent to the provision of healthcare and authorize for your insurance to be billed (if applicable) for the services provided during this visit. Depending on your insurance coverage, you may receive a charge related to this service.  I need to obtain your verbal consent now. Are you willing to proceed with your visit today? Hinley Brimage has provided verbal consent on 10/03/2022 for a virtual visit (video or telephone). Montine Circle, PA-C  Date: 10/03/2022 3:50 PM  Virtual Visit via Video Note   I, Montine Circle, connected with  Tiffani Kadow  ([id number], 1971-03-19) on 10/03/22 at  3:45 PM EST by a video-enabled telemedicine application and verified that I am speaking with the correct person using two identifiers.  Location: Patient: Virtual Visit Location Patient:  Home Provider: Virtual Visit Location Provider: Home Office   I discussed the limitations of evaluation and management by telemedicine and the availability of in person appointments. The patient expressed understanding and agreed to proceed.    History of Present Illness: Laterica Matarazzo is a 52 y.o. who identifies as a female who was assigned female at birth, and is being seen today for cough, sore throat, body aches for the past 5 days.  States that she hasn't taken a covid test.  States that she has had some watery eyes as well and they get matted and swollen shut.  States that she has taken OTC alka-seltzer cold and flu. Marland Kitchen  HPI: HPI  Problems:  Patient Active Problem List   Diagnosis Date Noted   Anxiety and depression 07/10/2017   Hypertriglyceridemia 09/17/2016   Vitamin D deficiency 09/16/2015   Rash and nonspecific skin eruption 09/16/2015   Preventative health care 09/16/2015   ABDOMINAL PAIN-MULTIPLE SITES 05/23/2009   Vitamin B 12 deficiency 04/05/2009   ABDOMINAL BLOATING 03/07/2009   ASCORBIC ACID DEFICIENCY 05/31/2008    Allergies:  Allergies  Allergen Reactions   Hycodan [Hydrocodone Bit-Homatrop Mbr] Itching and Swelling    Itching and gum swelling   Penicillins     Acute airway compromise   Medications:  Current Outpatient Medications:    diclofenac (VOLTAREN) 50 MG EC tablet, Take 1 tablet (50 mg total) by mouth 2 (two) times daily., Disp: 20 tablet, Rfl: 0   fluconazole (DIFLUCAN) 150 MG tablet, Take 1 tablet (150 mg  total) by mouth once a week., Disp: 6 tablet, Rfl: 1   predniSONE (STERAPRED UNI-PAK 21 TAB) 10 MG (21) TBPK tablet, Take by mouth daily. Take 6 tabs by mouth daily  for 2 days, then 5 tabs for 2 days, then 4 tabs for 2 days, then 3 tabs for 2 days, 2 tabs for 2 days, then 1 tab by mouth daily for 2 days, Disp: 42 tablet, Rfl: 0   tiZANidine (ZANAFLEX) 2 MG tablet, Take 1 tablet (2 mg total) by mouth every 8 (eight) hours as needed for muscle spasms.,  Disp: 15 tablet, Rfl: 0  Observations/Objective: Patient is well-developed, well-nourished in no acute distress.  Resting comfortably  at home.  Head is normocephalic, atraumatic.  No labored breathing.  Speech is clear and coherent with logical content.  Patient is alert and oriented at baseline.    Assessment and Plan: 1. Upper respiratory tract infection, unspecified type  Meds ordered this encounter  Medications   benzonatate (TESSALON) 100 MG capsule    Sig: Take 1 capsule (100 mg total) by mouth 2 (two) times daily as needed for cough.    Dispense:  20 capsule    Refill:  0    Order Specific Question:   Supervising Provider    Answer:   Chase Picket [[id number]]   ipratropium (ATROVENT) 0.03 % nasal spray    Sig: Place 2 sprays into both nostrils every 12 (twelve) hours.    Dispense:  30 mL    Refill:  0    Order Specific Question:   Supervising Provider    Answer:   Chase Picket [id number]    Supportive care for URI.  Recommend home COVID test.  IF not improving in 3-5 days, follow-up with a provider.  Follow Up Instructions: I discussed the assessment and treatment plan with the patient. The patient was provided an opportunity to ask questions and all were answered. The patient agreed with the plan and demonstrated an understanding of the instructions.  A copy of instructions were sent to the patient via MyChart unless otherwise noted below.     The patient was advised to call back or seek an in-person evaluation if the symptoms worsen or if the condition fails to improve as anticipated.  Time:  I spent 10 minutes with the patient via telehealth technology discussing the above problems/concerns.    Montine Circle, PA-C

## 2022-11-01 ENCOUNTER — Encounter: Payer: Self-pay | Admitting: Podiatry

## 2022-11-01 ENCOUNTER — Ambulatory Visit: Payer: BC Managed Care – PPO | Admitting: Podiatry

## 2022-11-01 VITALS — BP 134/86 | HR 76

## 2022-11-01 DIAGNOSIS — B353 Tinea pedis: Secondary | ICD-10-CM | POA: Diagnosis not present

## 2022-11-01 DIAGNOSIS — L309 Dermatitis, unspecified: Secondary | ICD-10-CM | POA: Diagnosis not present

## 2022-11-01 MED ORDER — TERBINAFINE HCL 250 MG PO TABS: 250.0000 mg | ORAL_TABLET | Freq: Every day | ORAL | 0 refills | Status: DC

## 2022-11-01 MED ORDER — BETAMETHASONE DIPROPIONATE 0.05 % EX CREA: TOPICAL_CREAM | Freq: Two times a day (BID) | CUTANEOUS | 1 refills | Status: DC
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
## 2022-11-01 NOTE — Patient Instructions (Signed)
Terbinafine Tablets What is this medication? TERBINAFINE (TER bin a feen) treats fungal infections of the nails. It belongs to a group of medications called antifungals. It will not treat infections caused by bacteria or viruses. This medicine may be used for other purposes; ask your health care provider or pharmacist if you have questions. COMMON BRAND NAME(S): Lamisil, Terbinex What should I tell my care team before I take this medication? They need to know if you have any of these conditions: Liver disease An unusual or allergic reaction to terbinafine, other medications, foods, dyes, or preservatives Pregnant or trying to get pregnant Breast-feeding How should I use this medication? Take this medication by mouth with water. Take it as directed on the prescription label at the same time every day. You can take it with or without food. If it upsets your stomach, take it with food. Keep taking it unless your care team tells you to stop. A special MedGuide will be given to you by the pharmacist with each prescription and refill. Be sure to read this information carefully each time. Talk to your care team regarding the use of this medication in children. Special care may be needed. Overdosage: If you think you have taken too much of this medicine contact a poison control center or emergency room at once. NOTE: This medicine is only for you. Do not share this medicine with others. What if I miss a dose? If you miss a dose, take it as soon as you can unless it is more than 4 hours late. If it is more than 4 hours late, skip the missed dose. Take the next dose at the normal time. What may interact with this medication? Do not take this medication with any of the following: Pimozide Thioridazine This medication may also interact with the following: Beta blockers Caffeine Certain medications for mental health conditions Cimetidine Cyclosporine Medications for fungal infections like fluconazole  and ketoconazole Medications for irregular heartbeat like amiodarone, flecainide and propafenone Rifampin Warfarin This list may not describe all possible interactions. Give your health care provider a list of all the medicines, herbs, non-prescription drugs, or dietary supplements you use. Also tell them if you smoke, drink alcohol, or use illegal drugs. Some items may interact with your medicine. What should I watch for while using this medication? Visit your care team for regular checks on your progress. You may need blood work while you are taking this medication. It may be some time before you see the benefit from this medication. This medication may cause serious skin reactions. They can happen weeks to months after starting the medication. Contact your care team right away if you notice fevers or flu-like symptoms with a rash. The rash may be red or purple and then turn into blisters or peeling of the skin. Or, you might notice a red rash with swelling of the face, lips or lymph nodes in your neck or under your arms. This medication can make you more sensitive to the sun. Keep out of the sun, If you cannot avoid being in the sun, wear protective clothing and sunscreen. Do not use sun lamps or tanning beds/booths. What side effects may I notice from receiving this medication? Side effects that you should report to your care team as soon as possible: Allergic reactions--skin rash, itching, hives, swelling of the face, lips, tongue, or throat Change in sense of smell Change in taste Infection--fever, chills, cough, or sore throat Liver injury--right upper belly pain, loss of appetite, nausea,  light-colored stool, dark yellow or brown urine, yellowing skin or eyes, unusual weakness or fatigue Low red blood cell level--unusual weakness or fatigue, dizziness, headache, trouble breathing Lupus-like syndrome--joint pain, swelling, or stiffness, butterfly-shaped rash on the face, rashes that get worse  in the sun, fever, unusual weakness or fatigue Rash, fever, and swollen lymph nodes Redness, blistering, peeling, or loosening of the skin, including inside the mouth Unusual bruising or bleeding Worsening mood, feelings of depression Side effects that usually do not require medical attention (report to your care team if they continue or are bothersome): Diarrhea Gas Headache Nausea Stomach pain Upset stomach This list may not describe all possible side effects. Call your doctor for medical advice about side effects. You may report side effects to FDA at 1-800-FDA-1088. Where should I keep my medication? Keep out of the reach of children and pets. Store between 20 and 25 degrees C (68 and 77 degrees F). Protect from light. Get rid of any unused medication after the expiration date. To get rid of medications that are no longer needed or have expired: Take the medication to a medication take-back program. Check with your pharmacy or law enforcement to find a location. If you cannot return the medication, check the label or package insert to see if the medication should be thrown out in the garbage or flushed down the toilet. If you are not sure, ask your care team. If it is safe to put it in the trash, take the medication out of the container. Mix the medication with cat litter, dirt, coffee grounds, or other unwanted substance. Seal the mixture in a bag or container. Put it in the trash. NOTE: This sheet is a summary. It may not cover all possible information. If you have questions about this medicine, talk to your doctor, pharmacist, or health care provider.  2023 Elsevier/Gold Standard (2021-03-07 00:00:00)

## 2022-11-22 ENCOUNTER — Ambulatory Visit: Payer: BC Managed Care – PPO | Admitting: Podiatry

## 2022-11-22 DIAGNOSIS — L309 Dermatitis, unspecified: Secondary | ICD-10-CM | POA: Diagnosis not present

## 2022-11-22 DIAGNOSIS — R21 Rash and other nonspecific skin eruption: Secondary | ICD-10-CM

## 2022-11-22 MED ORDER — CLOTRIMAZOLE-BETAMETHASONE 1-0.05 % EX CREA: 1.0000 | TOPICAL_CREAM | Freq: Two times a day (BID) | CUTANEOUS | 0 refills | Status: DC

## 2022-11-22 MED ORDER — METHYLPREDNISOLONE 4 MG PO TBPK: ORAL_TABLET | ORAL | 0 refills | Status: DC

## 2022-11-25 NOTE — Progress Notes (Signed)
Subjective: Chief Complaint  Patient presents with   Rash    Bilateral foot rash, patient stated that the rash has become better but still exists, itching has become worse    52 year old female presents the office with above concerns.  She states that the rash is getting better but is spread a little bit and still itches quite a bit.  She also reports that she looks like she is getting a rash on her back as well.  She is not sure if she has any type of allergy.  Objective: AAO x3, NAD DP/PT pulses palpable bilaterally, CRT less than 3 seconds Small erythematous rash, bumps are present mostly on the heel and minimally to the toes.  Overall she is quite a bit of itching to her feet.  There is no open lesions or any pustules.  There is no drainage. No pain with calf compression, swelling, warmth, erythema  Assessment: Tinea pedis, dermatitis  Plan: -All treatment options discussed with the patient including all alternatives, risks, complications.  -Recently it looks more like a fungal infection but there is present at the dermatitis.  He tried Medrol Dosepak as well as topical Lotrisone cream.  Discussed biopsy.  Not a great area biopsy today.  I put a referral in for dermatology as she reports the rash on her back as well now.  -Patient encouraged to call the office with any questions, concerns, change in symptoms.   Trula Slade DPM

## 2022-11-29 DIAGNOSIS — J3081 Allergic rhinitis due to animal (cat) (dog) hair and dander: Secondary | ICD-10-CM | POA: Diagnosis not present

## 2022-11-29 DIAGNOSIS — H1045 Other chronic allergic conjunctivitis: Secondary | ICD-10-CM | POA: Diagnosis not present

## 2022-11-29 DIAGNOSIS — J301 Allergic rhinitis due to pollen: Secondary | ICD-10-CM | POA: Diagnosis not present

## 2022-11-29 DIAGNOSIS — R21 Rash and other nonspecific skin eruption: Secondary | ICD-10-CM | POA: Diagnosis not present

## 2022-12-06 ENCOUNTER — Ambulatory Visit: Payer: BC Managed Care – PPO | Admitting: Podiatry

## 2022-12-17 DIAGNOSIS — J3081 Allergic rhinitis due to animal (cat) (dog) hair and dander: Secondary | ICD-10-CM | POA: Diagnosis not present

## 2022-12-17 DIAGNOSIS — R21 Rash and other nonspecific skin eruption: Secondary | ICD-10-CM | POA: Diagnosis not present

## 2022-12-17 DIAGNOSIS — H1045 Other chronic allergic conjunctivitis: Secondary | ICD-10-CM | POA: Diagnosis not present

## 2022-12-17 DIAGNOSIS — J301 Allergic rhinitis due to pollen: Secondary | ICD-10-CM | POA: Diagnosis not present

## 2022-12-19 DIAGNOSIS — R21 Rash and other nonspecific skin eruption: Secondary | ICD-10-CM | POA: Diagnosis not present

## 2022-12-19 DIAGNOSIS — J301 Allergic rhinitis due to pollen: Secondary | ICD-10-CM | POA: Diagnosis not present

## 2022-12-19 DIAGNOSIS — J3081 Allergic rhinitis due to animal (cat) (dog) hair and dander: Secondary | ICD-10-CM | POA: Diagnosis not present

## 2022-12-19 DIAGNOSIS — H1045 Other chronic allergic conjunctivitis: Secondary | ICD-10-CM | POA: Diagnosis not present

## 2022-12-21 DIAGNOSIS — L245 Irritant contact dermatitis due to other chemical products: Secondary | ICD-10-CM | POA: Diagnosis not present

## 2022-12-21 DIAGNOSIS — H1045 Other chronic allergic conjunctivitis: Secondary | ICD-10-CM | POA: Diagnosis not present

## 2022-12-21 DIAGNOSIS — J301 Allergic rhinitis due to pollen: Secondary | ICD-10-CM | POA: Diagnosis not present

## 2022-12-21 DIAGNOSIS — R21 Rash and other nonspecific skin eruption: Secondary | ICD-10-CM | POA: Diagnosis not present

## 2022-12-24 ENCOUNTER — Ambulatory Visit: Payer: BC Managed Care – PPO | Admitting: Podiatry

## 2022-12-27 ENCOUNTER — Encounter: Payer: Self-pay | Admitting: Dermatology

## 2022-12-27 ENCOUNTER — Ambulatory Visit: Payer: BC Managed Care – PPO | Admitting: Dermatology

## 2022-12-27 VITALS — BP 131/83

## 2022-12-27 DIAGNOSIS — L299 Pruritus, unspecified: Secondary | ICD-10-CM | POA: Diagnosis not present

## 2022-12-27 DIAGNOSIS — L308 Other specified dermatitis: Secondary | ICD-10-CM

## 2022-12-27 DIAGNOSIS — R21 Rash and other nonspecific skin eruption: Secondary | ICD-10-CM

## 2022-12-27 MED ORDER — BRYHALI 0.01 % EX LOTN: 1.0000 | TOPICAL_LOTION | Freq: Two times a day (BID) | CUTANEOUS | 1 refills | Status: DC

## 2022-12-27 NOTE — Patient Instructions (Addendum)
Your prescription was sent to [facility] in [place]. A representative from [employer] will contact you within 2 business hours to verify your address and insurance information to schedule a free delivery. If for any reason you do not receive a phone call from them, please reach out to them. Their phone number is [phone] and their hours are Monday-Friday 9:00 am-5:00 pm.    Due to recent changes in healthcare laws, you may see results of your pathology and/or laboratory studies on MyChart before the doctors have had a chance to review them. We understand that in some cases there may be results that are confusing or concerning to you. Please understand that not all results are received at the same time and often the doctors may need to interpret multiple results in order to provide you with the best plan of care or course of treatment. Therefore, we ask that you please give [place] 2 business days to thoroughly review all your results before contacting the office for clarification. Should we see a critical lab result, you will be contacted sooner.   If You Need Anything After Your Visit  If you have any questions or concerns for your doctor, please call our main line at [phone] If no one answers, please leave a voicemail as directed and we will return your call as soon as possible. Messages left after 4 pm will be answered the following business day.   You may also send [place] a message via MyChart. We typically respond to MyChart messages within 1-2 business days.  For prescription refills, please ask your pharmacy to contact our office. Our fax number is [phone].  If you have an urgent issue when the clinic is closed that cannot wait until the next business day, you can page your doctor at the number below.    Please note that while we do our best to be available for urgent issues outside of office hours, we are not available 24/7.   If you have an urgent issue and are  unable to reach [place], you may choose to seek medical care at your doctor's office, retail clinic, urgent care center, or emergency room.  If you have a medical emergency, please immediately call 911 or go to the emergency department. In the event of inclement weather, please call our main line at [phone] for an update on the status of any delays or closures.  Dermatology Medication Tips: Please keep the boxes that topical medications come in in order to help keep track of the instructions about where and how to use these. Pharmacies typically print the medication instructions only on the boxes and not directly on the medication tubes.   If your medication is too expensive, please contact our office at [phone] or send [place] a message through MyChart.   We are unable to tell what your co-pay for medications will be in advance as this is different depending on your insurance coverage. However, we may be able to find a substitute medication at lower cost or fill out paperwork to get insurance to cover a needed medication.   If a prior authorization is required to get your medication covered by your insurance company, please allow [place] 1-2 business days to complete this process.  Drug prices often vary depending on where the prescription is filled and some pharmacies may offer cheaper prices.  The website www.goodrx.com contains coupons for medications through different pharmacies. The prices here do not account for what the cost  may be with help from insurance (it may be cheaper with your insurance), but the website can give you the price if you did not use any insurance.  - You can print the associated coupon and take it with your prescription to the pharmacy.  - You may also stop by our office during regular business hours and pick up a GoodRx coupon card.  - If you need your prescription sent electronically to a different pharmacy, notify our office through [facility] or by phone at  [phone]    Patient Handout: Wound Care for Skin Biopsy Site  Patient Handout: Wound Care for Skin Biopsy Site  Taking Care of Your Skin Biopsy Site  Proper care of the biopsy site is essential for promoting healing and minimizing scarring. This handout provides instructions on how to care for your biopsy site to ensure optimal recovery.  1. Cleaning the Wound:  Clean the biopsy site daily with gentle soap and water. Gently pat the area dry with a clean, soft towel. Avoid harsh scrubbing or rubbing the area, as this can irritate the skin and delay healing.  2. Applying Aquaphor and Bandage:  After cleaning the wound, apply a thin layer of Aquaphor ointment to the biopsy site. Cover the area with a sterile bandage to protect it from dirt, bacteria, and friction. Change the bandage daily or as needed if it becomes soiled or wet.  3. Continued Care for One Week:  Repeat the cleaning, Aquaphor application, and bandaging process daily for one week following the biopsy procedure. Keeping the wound clean and moist during this initial healing period will help prevent infection and promote optimal healing.  4. Massaging Aquaphor into the Area:  ---After one week, discontinue the use of bandages but continue to apply Aquaphor to the biopsy site. ----Gently massage the Aquaphor into the area using circular motions. ---Massaging the skin helps to promote circulation and prevent the formation of scar tissue.   Additional Tips:  Avoid exposing the biopsy site to direct sunlight during the healing process, as this can cause hyperpigmentation or worsen scarring. If you experience any signs of infection, such as increased redness, swelling, warmth, or drainage from the wound, contact your healthcare provider immediately. Follow any additional instructions provided by your healthcare provider for caring for the biopsy site and managing any discomfort. Conclusion:  Taking proper care of  your skin biopsy site is crucial for ensuring optimal healing and minimizing scarring. By following these instructions for cleaning, applying Aquaphor, and massaging the area, you can promote a smooth and successful recovery. If you have any questions or concerns about caring for your biopsy site, don't hesitate to contact your healthcare provider for guidance.

## 2022-12-27 NOTE — Progress Notes (Signed)
   New Patient Visit   Subjective  Alyssa Baker is a 52 y.o. female who presents for the following: Itchy rash on the feet x 1 year and back x 2 years Itch is an 7-8 on a scale of 1-10. Feet are scaly and she has been treated for athletes foot. Dr. Margo Aye was her previous Dermatologist. She has been periodically treating with Betamethasone Dipropionate 0.05% which helped the back a little. She has also seen a Podiatrist who also gave Betamethasone. Allergist thinks its contact dermatitis, whom she saw 2-3 weeks ago. She did patch testing. She was positive for a few skincare ingredients but she can't recall the    The following portions of the chart were reviewed this encounter and updated as appropriate: medications, allergies, medical history  Review of Systems:  No other skin or systemic complaints except as noted in HPI or Assessment and Plan.  Objective  Well appearing patient in no apparent distress; mood and affect are within normal limits.  A focused examination was performed of the following areas: Back and feet  Relevant exam findings are noted in the Assessment and Plan.  Left Upper Back Pink pruritic papules with mild scale .    Assessment & Plan   PRURITUS Exam: pt exhibits active itching on back   Treatment Plan: -Possible psoriasis vs eczema (bx performed to rule out) -Pt tried almost all topical corticosteroids.  I explained to pt that since the skin on the soles is extremely thick, sometimes using a steroid cream that has a better vehicle can be more effective.  Pt is agreeable to trying Bryhali Lotion  -Bryhali cream 2 x daily x 3 weeks (rx sent to [facility])   Rash and other nonspecific skin eruption Left Upper Back  Skin / nail biopsy - Left Upper Back Type of biopsy: tangential   Informed consent: discussed and consent obtained   Timeout: patient name, date of birth, surgical site, and procedure verified   Procedure prep:  Patient was prepped and draped in  usual sterile fashion Prep type:  Isopropyl alcohol Anesthesia: the lesion was anesthetized in a standard fashion   Anesthetic:  1% lidocaine w/ epinephrine 1-100,000 buffered w/ 8.4% NaHCO3 Instrument used: DermaBlade   Hemostasis achieved with: aluminum chloride   Outcome: patient tolerated procedure well   Post-procedure details: sterile dressing applied and wound care instructions given   Dressing type: petrolatum gauze and bandage      Return in about 2 weeks (around 01/10/2023).  [name], CMA, am acting as scribe for Langston Reusing, MD.   Documentation: I have reviewed the above documentation for accuracy and completeness, and I agree with the above.  Langston Reusing, MD

## 2023-01-08 NOTE — Progress Notes (Signed)
Hi Tallia,  The biopsy results show that the rash is consistent with a diagnosis of eczema.  The current treatment for regimen you are on is very effective for treating eczema.  Please continue your current therapies and keep your follow up appointment to discuss long term treatment options (if needed)  Kind regards,  Dr. Onalee Hua

## 2023-01-10 ENCOUNTER — Ambulatory Visit: Payer: BC Managed Care – PPO | Admitting: Dermatology

## 2023-01-10 ENCOUNTER — Encounter: Payer: Self-pay | Admitting: Dermatology

## 2023-01-10 VITALS — BP 119/81

## 2023-01-10 DIAGNOSIS — L309 Dermatitis, unspecified: Secondary | ICD-10-CM | POA: Diagnosis not present

## 2023-01-10 NOTE — Progress Notes (Signed)
   Follow-Up Visit   Subjective  Alyssa Baker is a 52 y.o. female who presents for the following: Pruritus on the back and feet. She is using Bryhali cream 2 x daily. Back is a 0 on the itch scale. Feet are itchy and is a 6 on the itch scale. She started using the medication on her feet this past Tuesday. She is took Prednisone tabs given by Podiatrist. The last dose was on this past Monday.    The following portions of the chart were reviewed this encounter and updated as appropriate: medications, allergies, medical history  Review of Systems:  No other skin or systemic complaints except as noted in HPI or Assessment and Plan.  Objective  Well appearing patient in no apparent distress; mood and affect are within normal limits.   A focused examination was performed of the following areas: Back and feet  Relevant exam findings are noted in the Assessment and Plan.    Assessment & Plan   Contact Dermatitis vs Eczema Exam:         Treatment Plan:  Discussed possible triggers for a contact dermatitis however given that the locations (back and feet) are so distant from each other and have no overlapping contact agents, a diagnosis of eczema is favored.   Apply Bryhali to feet and apply liquid band aid or a thick layer of an ointment like Aquaphor. She can also try Colloidal bandage over the application of medication to help the open cut area heal faster    Bryhali can be used safely on feet for up to 2 months until symptoms improve.    Return in about 2 months (around 03/12/2023) for Rash Follow Up.  [name], CMA, am acting as scribe for [employer], DO.   Documentation: I have reviewed the above documentation for accuracy and completeness, and I agree with the above.  Langston Reusing, DO

## 2023-01-10 NOTE — Patient Instructions (Addendum)
Discussed precautions of using Prednisone. She can experience a rebound effect.  Apply Bryhali to feet and apply liquid band aid or a thick layer of an ointment like Aquaphor. She can also try Colloidal bandage over the application of medication.   Vivia Budge can be used safely until symptoms improve.    Due to recent changes in healthcare laws, you may see results of your pathology and/or laboratory studies on MyChart before the doctors have had a chance to review them. We understand that in some cases there may be results that are confusing or concerning to you. Please understand that not all results are received at the same time and often the doctors may need to interpret multiple results in order to provide you with the best plan of care or course of treatment. Therefore, we ask that you please give [place] 2 business days to thoroughly review all your results before contacting the office for clarification. Should we see a critical lab result, you will be contacted sooner.   If You Need Anything After Your Visit  If you have any questions or concerns for your doctor, please call our main line at [phone] If no one answers, please leave a voicemail as directed and we will return your call as soon as possible. Messages left after 4 pm will be answered the following business day.   You may also send [place] a message via MyChart. We typically respond to MyChart messages within 1-2 business days.  For prescription refills, please ask your pharmacy to contact our office. Our fax number is [phone].  If you have an urgent issue when the clinic is closed that cannot wait until the next business day, you can page your doctor at the number below.    Please note that while we do our best to be available for urgent issues outside of office hours, we are not available 24/7.   If you have an urgent issue and are unable to reach [place], you may choose to seek medical care at your doctor's office, retail clinic,  urgent care center, or emergency room.  If you have a medical emergency, please immediately call 911 or go to the emergency department. In the event of inclement weather, please call our main line at [phone] for an update on the status of any delays or closures.  Dermatology Medication Tips: Please keep the boxes that topical medications come in in order to help keep track of the instructions about where and how to use these. Pharmacies typically print the medication instructions only on the boxes and not directly on the medication tubes.   If your medication is too expensive, please contact our office at [phone] or send [place] a message through MyChart.   We are unable to tell what your co-pay for medications will be in advance as this is different depending on your insurance coverage. However, we may be able to find a substitute medication at lower cost or fill out paperwork to get insurance to cover a needed medication.   If a prior authorization is required to get your medication covered by your insurance company, please allow [place] 1-2 business days to complete this process.  Drug prices often vary depending on where the prescription is filled and some pharmacies may offer cheaper prices.  The website www.goodrx.com contains coupons for medications through different pharmacies. The prices here do not account for what the cost may be with help from insurance (it may be cheaper with your insurance), but the website can  give you the price if you did not use any insurance.  - You can print the associated coupon and take it with your prescription to the pharmacy.  - You may also stop by our office during regular business hours and pick up a GoodRx coupon card.  - If you need your prescription sent electronically to a different pharmacy, notify our office through [facility] or by phone at [phone]

## 2023-01-18 ENCOUNTER — Ambulatory Visit: Payer: BC Managed Care – PPO | Admitting: Podiatry

## 2023-01-18 DIAGNOSIS — L308 Other specified dermatitis: Secondary | ICD-10-CM

## 2023-01-18 MED ORDER — PROMETHAZINE HCL 25 MG PO TABS: 25.0000 mg | ORAL_TABLET | Freq: Three times a day (TID) | ORAL | 0 refills | Status: DC | PRN

## 2023-01-18 NOTE — Progress Notes (Signed)
Subjective: Chief Complaint  Patient presents with   Follow-up    skin rash 2 month follow up. Patient was seen by dermatology and was prescribed a cream and she has been using the cream for a few weeks. Patient stated she is better but continues to experience itchiness, skin peeling and dryness.    52 year old female presents the above concerns.  Overall she is doing better.  She has been using the cream prescribed to dermatology, halbetsole.  She said that she still included itching to the feet.   Objective: AAO x3, NAD DP/PT pulses palpable bilaterally, CRT less than 3 seconds Overall skin rash is much improved to the foot.  She still describes quite a bit of itching to the feet.  There is no open lesions.  There is no drainage or pus.  No signs of infection. No pain with calf compression, swelling, warmth, erythema  Assessment: 52 year old female with skin rash, likely eczema  Plan: -All treatment options discussed with the patient including all alternatives, risks, complications.  -She is in continue with the cream prescribed by dermatology for her feet.  Given the itching she tried Benadryl and other medication without improvement.  Prescribed Phenergan.  If no improvement or if the lesions recur will biopsy of the foot but it is doing better today and not any significant lesion to biopsy.  -Patient encouraged to call the office with any questions, concerns, change in symptoms.   Alyssa Baker DPM

## 2023-03-12 ENCOUNTER — Encounter: Payer: Self-pay | Admitting: Dermatology

## 2023-03-12 ENCOUNTER — Ambulatory Visit (INDEPENDENT_AMBULATORY_CARE_PROVIDER_SITE_OTHER): Payer: BC Managed Care – PPO | Admitting: Dermatology

## 2023-03-12 VITALS — BP 140/90 | HR 77

## 2023-03-12 DIAGNOSIS — R61 Generalized hyperhidrosis: Secondary | ICD-10-CM | POA: Diagnosis not present

## 2023-03-12 DIAGNOSIS — L309 Dermatitis, unspecified: Secondary | ICD-10-CM | POA: Diagnosis not present

## 2023-03-12 DIAGNOSIS — L74519 Primary focal hyperhidrosis, unspecified: Secondary | ICD-10-CM

## 2023-03-12 MED ORDER — BRYHALI 0.01 % EX LOTN: 1.0000 | TOPICAL_LOTION | Freq: Two times a day (BID) | CUTANEOUS | 2 refills | Status: DC

## 2023-03-12 MED ORDER — TACROLIMUS 0.1 % EX OINT: TOPICAL_OINTMENT | CUTANEOUS | 2 refills | Status: DC

## 2023-03-12 NOTE — Patient Instructions (Addendum)
Thank you for visiting my office today and for your dedication to improving your health. I appreciate your efforts in managing your eczema and am committed to helping you find the most effective treatment.  Here is a summary of the key instructions from today's consultation:  - Medications:   - Continue using Berhali once daily.   - Begin using a lotion with pramoxine to help manage itching. This can be mixed with Berhali.   - Apply DrySol or CertainDri on your feet at night to reduce sweating. Wash it off in the morning before applying clobetasol.   - Take a break from [place] and switch to Tacrolimus cream for two to three weeks to manage symptoms. If there is no improvement, you may return to using Berhali.  - Prescriptions and Pharmacy:   - Additional clobetasol will be sent to [facility].   - Tacrolimus will be sent to your regular pharmacy.  - Follow-Up Care:   - Schedule a follow-up appointment in three months to assess progress.   - If symptoms persist or worsen, we may consider an injection of steroid as a next step.  - Additional Recommendations:   - Try to minimize activities that increase foot sweating, especially during summer.  Please follow these instructions closely and do not hesitate to contact the office if you have any questions or concerns. We are here to support you on your path to better skin health.      Continue Bryhali once or twice daily to feet as directed when symptomatic.  Start Tacrolimus ointment twice daily to feet 2-3 weeks. Use while taking breaks from [place]. Start Certain Dri at bedtime to feet, wash off in morning.     Recommend using OTC Certain Dri. Available in spray, roll-on and wipes.     Due to recent changes in healthcare laws, you may see results of your pathology and/or laboratory studies on MyChart before the doctors have had a chance to review them. We understand that in some cases there may be results that are confusing  or concerning to you. Please understand that not all results are received at the same time and often the doctors may need to interpret multiple results in order to provide you with the best plan of care or course of treatment. Therefore, we ask that you please give [place] 2 business days to thoroughly review all your results before contacting the office for clarification. Should we see a critical lab result, you will be contacted sooner.   If You Need Anything After Your Visit  If you have any questions or concerns for your doctor, please call our main line at [phone] If no one answers, please leave a voicemail as directed and we will return your call as soon as possible. Messages left after 4 pm will be answered the following business day.   You may also send [place] a message via MyChart. We typically respond to MyChart messages within 1-2 business days.  For prescription refills, please ask your pharmacy to contact our office. Our fax number is [phone].  If you have an urgent issue when the clinic is closed that cannot wait until the next business day, you can page your doctor at the number below.    Please note that while we do our best to be available for urgent issues outside of office hours, we are not available 24/7.   If you have an urgent issue and are unable to reach [place], you may choose to seek  medical care at your doctor's office, retail clinic, urgent care center, or emergency room.  If you have a medical emergency, please immediately call 911 or go to the emergency department. In the event of inclement weather, please call our main line at [phone] for an update on the status of any delays or closures.  Dermatology Medication Tips: Please keep the boxes that topical medications come in in order to help keep track of the instructions about where and how to use these. Pharmacies typically print the medication instructions only on the boxes and not directly on the medication  tubes.   If your medication is too expensive, please contact our office at [phone] or send [place] a message through MyChart.   We are unable to tell what your co-pay for medications will be in advance as this is different depending on your insurance coverage. However, we may be able to find a substitute medication at lower cost or fill out paperwork to get insurance to cover a needed medication.   If a prior authorization is required to get your medication covered by your insurance company, please allow [place] 1-2 business days to complete this process.  Drug prices often vary depending on where the prescription is filled and some pharmacies may offer cheaper prices.  The website www.goodrx.com contains coupons for medications through different pharmacies. The prices here do not account for what the cost may be with help from insurance (it may be cheaper with your insurance), but the website can give you the price if you did not use any insurance.  - You can print the associated coupon and take it with your prescription to the pharmacy.  - You may also stop by our office during regular business hours and pick up a GoodRx coupon card.  - If you need your prescription sent electronically to a different pharmacy, notify our office through [facility] or by phone at [phone]

## 2023-03-12 NOTE — Progress Notes (Signed)
   Follow Up Visit   Subjective  Alyssa Baker is a 52 y.o. female who presents for the following: 2 month eczema follow up. Using Bryhali on feet. States back is fine, feet are not. Back is no longer itching. Feet are still scaling and itchy but have improved.    patient reports itching and burning sensations. She has a history of using betamethasone but currently prefers [place]. A previous biopsy confirmed the diagnosis of eczema and ruled out psoriasis. The patient's back has improved significantly, with no itching, rash, or other issues. She has tried antifungal treatments in the past without success. The patient identifies sweating as a potential trigger for her eczema and wears sandals during the summer months. She is currently applying Bryhali once a day and has not tried mixing it with anti-itch lotions like Sarna or CeraVe. The patient's feet have shown some improvement but have not completely healed. She took a break from the [place] for a couple of days and tried an antifungal, but it did not fully resolve the issue.  The following portions of the chart were reviewed this encounter and updated as appropriate: medications, allergies, medical history  Review of Systems:  No other skin or systemic complaints except as noted in HPI or Assessment and Plan.  Objective  Well appearing patient in no apparent distress; mood and affect are within normal limits.  A focused examination was performed of the following areas: Back, feet  Relevant exam findings are noted in the Assessment and Plan.         Assessment & Plan     Eczema/dermatitis Exam: Back clear today. Mild xerosis and scale ant mid plantar feet, B/L.   Chronic and persistent condition with duration or expected duration over one year. Condition is symptomatic/ bothersome to patient. Improved but not currently at goal.   - Assessment: Eczema managed effectively with daily application of Bryhali. - Plan: Continue Bryhali  once daily. Consider blending Bryhali with anti-itch lotions such as Sarna or CeraVe for enhanced relief. Initiate a trial of Tacrolimus cream on the feet for two to three weeks as a non-steroidal anti-itch and anti-inflammatory option. If no improvement after three weeks, revert to [place] use. Refills for Brijali and Tacrolimus to be processed through [facility].  2. Hyperhidrosis (trigger for eczema on feet) - Assessment: Excessive sweating exacerbating eczema symptoms. - Plan: Apply DrySol or CertainDri on the feet nightly to mitigate sweating. Follow with morning wash and application of clobetasol.  3. Follow-Up Care - Plan: Schedule a follow-up appointment in three months to evaluate treatment efficacy. If no significant improvement is observed, consider a steroid injection as an alternative approach. Capture updated images of the patient's feet for assessment during the follow-up.    Return in about 3 months (around 06/12/2023) for Rash Follow Up.  I, Lawson Radar, CMA, am acting as scribe for [employer], DO.   Documentation: I have reviewed the above documentation for accuracy and completeness, and I agree with the above.  Langston Reusing, DO

## 2023-03-20 ENCOUNTER — Encounter: Payer: Self-pay | Admitting: Dermatology

## 2023-05-22 ENCOUNTER — Encounter: Payer: Self-pay | Admitting: Dermatology

## 2023-06-12 ENCOUNTER — Ambulatory Visit: Payer: BC Managed Care – PPO | Admitting: Dermatology

## 2023-06-12 VITALS — BP 132/88

## 2023-06-12 DIAGNOSIS — L659 Nonscarring hair loss, unspecified: Secondary | ICD-10-CM | POA: Diagnosis not present

## 2023-06-12 DIAGNOSIS — L309 Dermatitis, unspecified: Secondary | ICD-10-CM

## 2023-06-12 MED ORDER — SAFETY SEAL MISCELLANEOUS MISC: 1.0000 "application " | 3 refills | Status: DC

## 2023-06-12 NOTE — Progress Notes (Signed)
   Follow-Up Visit   Subjective  Alyssa Baker is a 52 y.o. female who presents for the following: Eczema follow up - Eczema is back on feet right now. She tried [place] and Tacrolimus and they made the itching worse so she is not using anything right now. The Bryhali did clear it but the itching came right back and was worse. It gets worse with heat.  She has noticed a lot of hair shedding recently. No illnesses, surgeries or increased stress.   The following portions of the chart were reviewed this encounter and updated as appropriate: medications, allergies, medical history  Review of Systems:  No other skin or systemic complaints except as noted in HPI or Assessment and Plan.  Objective  Well appearing patient in no apparent distress; mood and affect are within normal limits.  A focused examination was performed of the following areas: Feet   Relevant exam findings are noted in the Assessment and Plan.    Assessment & Plan   1.Biopsy proven Eczema - Assessment: Patient experiences itching and burning, exacerbated by heat. Symptoms worsened with previous treatments, possibly due to sensitivity or allergy to preservatives. - Plan: Prescribe a compounded topical medication containing lidocaine, gabapentin, and amitriptyline from [facility] compounding pharmacy. Instruct the patient to apply at nighttime or twice a day, with a focus on nighttime application. Reevaluate in three months to assess the effectiveness of the treatment. Consider oral medication for eczema if the current plan is not effective.   2. Hair Thinning - Assessment: Patient reports hair thinning for three years, with increased shedding in handfuls, especially during washing and brushing. No recent surgeries or illnesses. Currently using Viviscal and Vital Proteins collagen supplement. - Plan: Continue with Vital Proteins collagen supplement (1.5 scoops daily) and start either Nutrafol or Viviscal for three months.  Reevaluate in three months to assess hair growth. If no improvement, consider a biopsy. Monitor for signs of hormonal thinning and adjust the treatment plan accordingly.  Eczema, unspecified type  Hair thinning    Return in about 3 months (around 09/12/2023) for Follow up.  I, Joanie Coddington, CMA, am acting as scribe for [employer], DO .   Documentation: I have reviewed the above documentation for accuracy and completeness, and I agree with the above.  Langston Reusing, DO

## 2023-06-12 NOTE — Patient Instructions (Addendum)
Hello Alyssa Baker,  Thank you for visiting my office today. Your dedication to addressing your dermatological concerns and improving your health is greatly appreciated. Here is a summary of the key instructions and recommendations from today's consultation:  - Skin Care for Feet:   - Patch Testing Results: Obtain results from previous testing to identify specific allergens.   - Prescription Medication: Try a compounded topical medication from [facility], which includes lidocaine, gabapentin, and amitriptyline.     - Application: Apply a small amount at night, or twice daily if needed, to help alleviate itching and burning sensations.     - Pharmacy Contact: The pharmacy will reach out to confirm details and payment. If you do not hear from them within 24 hours, please contact them directly. Expect calls from [facility] or [place] numbers.   - Follow-Up: Schedule a follow-up in three months to evaluate the effectiveness of the treatment.  - Hair Thinning:   - Current Supplements: Continue using Viviscal and Vital Proteins as previously started.   - Additional Supplement: Consider starting Nutrafol if no improvement is seen with current supplements. Nutrafol, costing $90 per month, requires a daily intake of four tablets and offers a more comprehensive solution.   - Monitoring: Monitor for new hair growth over the next three months. If no improvement is observed, we may consider further diagnostic steps, such as a biopsy.  Please ensure to follow the instructions as discussed. Do not hesitate to contact the office if you have any questions or concerns in the meantime.  Looking forward to our follow-up in three months.  Best regards,  Dr. [facility]ston Reusing [facility]       Your provider has sent your prescription to [facility] in [place], [place]. A pharmacy representative will call you to confirm details and take your payment information. If you do not receive a call within 24  hours, please contact the pharmacy at [phone] or [phone]. Your unique skincare compound is being formulated in our lab (most compounds take less than 24 hours). Your prescription is shipped vis USPS to your mailbox (2-4 business days). Priority shipping is available at an additional cost. Once received, you will electronically sign/acknowledge that you received your prescription. The pharmacy hours are Monday-Friday 9 am-6 pm EST and Saturday 9 am-1 pm EST.          Important Information  Due to recent changes in healthcare laws, you may see results of your pathology and/or laboratory studies on MyChart before the doctors have had a chance to review them. We understand that in some cases there may be results that are confusing or concerning to you. Please understand that not all results are received at the same time and often the doctors may need to interpret multiple results in order to provide you with the best plan of care or course of treatment. Therefore, we ask that you please give [place] 2 business days to thoroughly review all your results before contacting the office for clarification. Should we see a critical lab result, you will be contacted sooner.   If You Need Anything After Your Visit  If you have any questions or concerns for your doctor, please call our main line at [phone] If no one answers, please leave a voicemail as directed and we will return your call as soon as possible. Messages left after 4 pm will be answered the following business day.   You may also send [place] a message via MyChart. We typically respond to MyChart messages  within 1-2 business days.  For prescription refills, please ask your pharmacy to contact our office. Our fax number is [phone].  If you have an urgent issue when the clinic is closed that cannot wait until the next business day, you can page your doctor at the number below.    Please note that while we do our best to be available for  urgent issues outside of office hours, we are not available 24/7.   If you have an urgent issue and are unable to reach [place], you may choose to seek medical care at your doctor's office, retail clinic, urgent care center, or emergency room.  If you have a medical emergency, please immediately call 911 or go to the emergency department. In the event of inclement weather, please call our main line at [phone] for an update on the status of any delays or closures.  Dermatology Medication Tips: Please keep the boxes that topical medications come in in order to help keep track of the instructions about where and how to use these. Pharmacies typically print the medication instructions only on the boxes and not directly on the medication tubes.   If your medication is too expensive, please contact our office at [phone] or send [place] a message through MyChart.   We are unable to tell what your co-pay for medications will be in advance as this is different depending on your insurance coverage. However, we may be able to find a substitute medication at lower cost or fill out paperwork to get insurance to cover a needed medication.   If a prior authorization is required to get your medication covered by your insurance company, please allow [place] 1-2 business days to complete this process.  Drug prices often vary depending on where the prescription is filled and some pharmacies may offer cheaper prices.  The website www.goodrx.com contains coupons for medications through different pharmacies. The prices here do not account for what the cost may be with help from insurance (it may be cheaper with your insurance), but the website can give you the price if you did not use any insurance.  - You can print the associated coupon and take it with your prescription to the pharmacy.  - You may also stop by our office during regular business hours and pick up a GoodRx coupon card.  - If you need your prescription  sent electronically to a different pharmacy, notify our office through [facility] or by phone at [phone]

## 2023-06-16 ENCOUNTER — Encounter: Payer: Self-pay | Admitting: Dermatology

## 2023-08-01 ENCOUNTER — Encounter: Payer: Self-pay | Admitting: Emergency Medicine

## 2023-08-01 ENCOUNTER — Ambulatory Visit
Admission: EM | Admit: 2023-08-01 | Discharge: 2023-08-01 | Disposition: A | Discharge status: Home / Self Care | Payer: BC Managed Care – PPO | Attending: Emergency Medicine | Admitting: Emergency Medicine

## 2023-08-01 DIAGNOSIS — A084 Viral intestinal infection, unspecified: Secondary | ICD-10-CM | POA: Diagnosis not present

## 2023-08-01 MED ORDER — DICYCLOMINE HCL 20 MG PO TABS: 20.0000 mg | ORAL_TABLET | Freq: Two times a day (BID) | ORAL | 0 refills | Status: DC

## 2023-08-01 MED ORDER — ONDANSETRON 4 MG PO TBDP: 4.0000 mg | ORAL_TABLET | Freq: Three times a day (TID) | ORAL | 0 refills | Status: DC | PRN

## 2023-08-01 MED ORDER — ONDANSETRON 4 MG PO TBDP
4.0000 mg | ORAL_TABLET | Freq: Once | ORAL | Status: AC
  Administered 2023-08-01: 4 mg via ORAL

## 2023-08-01 NOTE — ED Triage Notes (Signed)
Pt c/o nausea/ vomiting unable to keep any food down, diarrhea, chills x 4 days. Pt has tried tylenol and dayquil with no help.
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
## 2023-08-01 NOTE — Discharge Instructions (Addendum)
Take Zofran (ondansetron) as prescribed as needed to help with nausea and vomiting - you were given a dose of this here today. Take Bentyl (dicyclomine) as prescribed as needed to help with abdominal cramping and diarrhea. Keep hydrated. Gradually advance diet as tolerated, but liquids are more important than solid food. Follow-up with PCP if no improvement in a week. Go to the ER if develop severe abdominal pain, or vomiting to where you cannot keep down any fluids for more than 24 hours despite the Zofran.

## 2023-09-16 ENCOUNTER — Ambulatory Visit: Payer: BC Managed Care – PPO | Admitting: Dermatology

## 2023-09-16 ENCOUNTER — Encounter: Payer: Self-pay | Admitting: Dermatology

## 2023-09-16 DIAGNOSIS — L209 Atopic dermatitis, unspecified: Secondary | ICD-10-CM | POA: Diagnosis not present

## 2023-09-16 MED ORDER — DUPIXENT 300 MG/2ML ~~LOC~~ SOAJ: 300.0000 mg | SUBCUTANEOUS | 10 refills | Status: DC

## 2023-09-16 NOTE — Patient Instructions (Addendum)
Hello Alyssa Baker,  Thank you for visiting my office today. Below is a summary of our discussion and the treatment plan we have outlined for you:  Medication: We are initiating treatment with Dupixent, an injectable medication specifically designed for severe eczema. This medication aims to target inflammatory markers without suppressing the immune system.   Dosage: You will start with an initial dose of 600 mg, which consists of two injections (we did this today in office). Following this, you will administer one injection every two weeks.   Administration: These injections can be self-administered either in the thighs or belly, with the thighs being the generally preferred site for ease of administration.   Supply: Once insurance approves the medication, The injections will be mailed to you monthly. A box has been provided to you to start.  Diet and Supplements:   Dietary Adjustments: It is recommended to reduce your intake of sugar, carbs, and red meat to help manage inflammation. Conversely, increase your intake of green leafy vegetables, berries, and salmon.   Supplements: Consider incorporating Omega-3s and Vitamin D supplements into your regimen.  Insurance:  Approval for this medication typically takes about 3 weeks.  Follow-Up: Please schedule a follow-up appointment in three months to evaluate the effectiveness of the treatment. While most patients report noticing improvement within a month, the full effects are generally observed within three to six months.  Educational Material: You have been provided with literature about Dupixent, including details on how to administer the medication.  We look forward to monitoring your progress and are here to support you every step of the way. Should you have any questions or concerns before our next appointment, please do not hesitate to reach out to the office.  Warm regards,  Dr. Langston Reusing, Dermatology  Important Information  Due to  recent changes in healthcare laws, you may see results of your pathology and/or laboratory studies on MyChart before the doctors have had a chance to review them. We understand that in some cases there may be results that are confusing or concerning to you. Please understand that not all results are received at the same time and often the doctors may need to interpret multiple results in order to provide you with the best plan of care or course of treatment. Therefore, we ask that you please give [place] 2 business days to thoroughly review all your results before contacting the office for clarification. Should we see a critical lab result, you will be contacted sooner.   If You Need Anything After Your Visit  If you have any questions or concerns for your doctor, please call our main line at [phone] If no one answers, please leave a voicemail as directed and we will return your call as soon as possible. Messages left after 4 pm will be answered the following business day.   You may also send [place] a message via MyChart. We typically respond to MyChart messages within 1-2 business days.  For prescription refills, please ask your pharmacy to contact our office. Our fax number is [phone].  If you have an urgent issue when the clinic is closed that cannot wait until the next business day, you can page your doctor at the number below.    Please note that while we do our best to be available for urgent issues outside of office hours, we are not available 24/7.   If you have an urgent issue and are unable to reach [place], you may choose to seek medical  care at your doctor's office, retail clinic, urgent care center, or emergency room.  If you have a medical emergency, please immediately call 911 or go to the emergency department. In the event of inclement weather, please call our main line at [phone] for an update on the status of any delays or closures.  Dermatology Medication Tips: Please keep  the boxes that topical medications come in in order to help keep track of the instructions about where and how to use these. Pharmacies typically print the medication instructions only on the boxes and not directly on the medication tubes.   If your medication is too expensive, please contact our office at [phone] or send [place] a message through MyChart.   We are unable to tell what your co-pay for medications will be in advance as this is different depending on your insurance coverage. However, we may be able to find a substitute medication at lower cost or fill out paperwork to get insurance to cover a needed medication.   If a prior authorization is required to get your medication covered by your insurance company, please allow [place] 1-2 business days to complete this process.  Drug prices often vary depending on where the prescription is filled and some pharmacies may offer cheaper prices.  The website www.goodrx.com contains coupons for medications through different pharmacies. The prices here do not account for what the cost may be with help from insurance (it may be cheaper with your insurance), but the website can give you the price if you did not use any insurance.  - You can print the associated coupon and take it with your prescription to the pharmacy.  - You may also stop by our office during regular business hours and pick up a GoodRx coupon card.  - If you need your prescription sent electronically to a different pharmacy, notify our office through [facility] or by phone at [phone]

## 2023-09-16 NOTE — Progress Notes (Signed)
Follow-Up Visit   Subjective  Alyssa Baker is a 53 y.o. female who presents for the following: eczema - hair thinning  Patient present today for follow up visit. Patient was last evaluated on 06/12/23. Patient reports sxs are better but not as much as pt was hoping for in the past 17mo. She stated that she has been applying the cream from [facility] for the eczema and has been using the Viviscal and vital Proteins supplements as recommended. Patient denies medication changes.  The following portions of the chart were reviewed this encounter and updated as appropriate: medications, allergies, medical history  Review of Systems:  No other skin or systemic complaints except as noted in HPI or Assessment and Plan.  Objective  Well appearing patient in no apparent distress; mood and affect are within normal limits.   A focused examination was performed of the following areas: feet and scalp   Relevant exam findings are noted in the Assessment and Plan.           Assessment & Plan   ATOPIC DERMATITIS/ECZEMA Exam: Scaly pink papules coalescing to plaques located at special site bilateral sole of feet 5% BSA (Special Site involving Feet)  Assessment: Patient presents with persistent foot eczema, previously treated with topical cream which provided initial relief but subsequently became ineffective. Symptoms include itching, burning, and cracking, exacerbated by sweating, warm feet, and walking. A prior biopsy confirmed the eczema diagnosis. Current management with moisturizer has been insufficient. The condition is bilateral and severe enough to warrant consideration of systemic therapy. Psoriasis was considered in the differential but deemed less likely due to the absence of associated symptoms like joint pain.  Plan:   Initiate Dupixent (dupilumab) therapy: 600 mg loading dose (2 injections) administered today, followed by 300 mg every two weeks.   Provide patient education on Dupixent  administration technique.   Counsel on potential dietary modifications to reduce inflammation: increase intake of green leafy vegetables, berries, salmon; reduce sugar, carbohydrates, and red meat consumption.   Recommend omega-3 and vitamin D supplements as adjunctive therapy.   Schedule follow-up appointment in 3 months.   Inform patient to expect noticeable improvement within 1 month, with full effect potentially taking 3-6 months.   Arrange for monthly delivery of Dupixent auto-injectors to patient's home.  Plan: Dupixent Initiation Indications:  Patient isn't a candidate for systemic therapy with methotrexate or cyclosporine. Patient has been unresponsive to aggressive topical therapy.  Failed Treatments: Topical Steroids and Topical Protopic  Treatment Protocol: 600 mg [place] day 0 then 300 mg [place] every other week  Specific Contraindications Cyclosporine is contraindicated because the patient will not be able to complete the necessary follow-up labs. Methotrexate is contraindicated because the patient will not be able to complete the necessary follow-up labs. Phototherapy is contraindicated because the patient lives too far from the treatment location.   Dupixent Counseling: I discussed with the patient the risks of dupilumab including but not limited to eye infection and irritation, cold sores, injection site reactions, worsening of asthma, allergic reactions and increased risk of parasitic infection. Live vaccines should be avoided while taking dupilumab. Dupilumab will also interact with certain medications such as warfarin and cyclosporine. The patient understands that monitoring is required and they must alert [place] or the primary physician if symptoms of infection or other concerning signs are noted.  Dupixent Monitoring: There is no laboratory monitoring requirement with Dupixent.    No follow-ups on file.    Documentation: I have reviewed the above  documentation for accuracy  and completeness, and I agree with the above.  I, Shirron Marcha Solders, CMA, am acting as scribe for [employer], DO.   Langston Reusing, DO

## 2023-10-02 ENCOUNTER — Ambulatory Visit: Admitting: Dermatology

## 2023-11-12 ENCOUNTER — Encounter: Payer: Self-pay | Admitting: Dermatology

## 2023-11-14 ENCOUNTER — Ambulatory Visit (INDEPENDENT_AMBULATORY_CARE_PROVIDER_SITE_OTHER): Admitting: Dermatology

## 2023-11-14 VITALS — BP 128/84

## 2023-11-14 DIAGNOSIS — L249 Irritant contact dermatitis, unspecified cause: Secondary | ICD-10-CM | POA: Diagnosis not present

## 2023-11-14 DIAGNOSIS — D692 Other nonthrombocytopenic purpura: Secondary | ICD-10-CM

## 2023-11-14 DIAGNOSIS — L209 Atopic dermatitis, unspecified: Secondary | ICD-10-CM | POA: Diagnosis not present

## 2023-11-14 NOTE — Patient Instructions (Addendum)
 Hello Augusto Gamble,  Thank you for visiting today. Here is a summary of the key instructions:  - Medications: Use Bryhali cream twice a day until the skin condition is gone   - [employer] with a little Aquaphor or Vaseline to reduce stinging   - Use for up to 3 weeks, then take a 2-week break if needed   - Do not use for more than 8 weeks total  - Skin Care:   - Switch to Aveeno shaving gel   - Use Gillette Mach5 Fusion razor for sensitive skin   - Avoid using lotions right after shaving   - Apply ice packs to help with ache and burn on shins  - Follow-up:   - Send a MyTrip message in 2 weeks to report if the condition is improving   - Call the office if symptoms return after your next Dupixent injection   - Attend your scheduled follow-up appointment in April  Please reach out if you have any questions or concerns.  Warm regards,  Dr. Langston Reusing, Dermatology         Important Information  Due to recent changes in healthcare laws, you may see results of your pathology and/or laboratory studies on MyChart before the doctors have had a chance to review them. We understand that in some cases there may be results that are confusing or concerning to you. Please understand that not all results are received at the same time and often the doctors may need to interpret multiple results in order to provide you with the best plan of care or course of treatment. Therefore, we ask that you please give [place] 2 business days to thoroughly review all your results before contacting the office for clarification. Should we see a critical lab result, you will be contacted sooner.   If You Need Anything After Your Visit  If you have any questions or concerns for your doctor, please call our main line at [phone] If no one answers, please leave a voicemail as directed and we will return your call as soon as possible. Messages left after 4 pm will be answered the following business day.   You  may also send [place] a message via MyChart. We typically respond to MyChart messages within 1-2 business days.  For prescription refills, please ask your pharmacy to contact our office. Our fax number is [phone].  If you have an urgent issue when the clinic is closed that cannot wait until the next business day, you can page your doctor at the number below.    Please note that while we do our best to be available for urgent issues outside of office hours, we are not available 24/7.   If you have an urgent issue and are unable to reach [place], you may choose to seek medical care at your doctor's office, retail clinic, urgent care center, or emergency room.  If you have a medical emergency, please immediately call 911 or go to the emergency department. In the event of inclement weather, please call our main line at [phone] for an update on the status of any delays or closures.  Dermatology Medication Tips: Please keep the boxes that topical medications come in in order to help keep track of the instructions about where and how to use these. Pharmacies typically print the medication instructions only on the boxes and not directly on the medication tubes.   If your medication is too expensive, please contact our office at [phone] or  send [place] a message through MyChart.   We are unable to tell what your co-pay for medications will be in advance as this is different depending on your insurance coverage. However, we may be able to find a substitute medication at lower cost or fill out paperwork to get insurance to cover a needed medication.   If a prior authorization is required to get your medication covered by your insurance company, please allow [place] 1-2 business days to complete this process.  Drug prices often vary depending on where the prescription is filled and some pharmacies may offer cheaper prices.  The website www.goodrx.com contains coupons for medications through different  pharmacies. The prices here do not account for what the cost may be with help from insurance (it may be cheaper with your insurance), but the website can give you the price if you did not use any insurance.  - You can print the associated coupon and take it with your prescription to the pharmacy.  - You may also stop by our office during regular business hours and pick up a GoodRx coupon card.  - If you need your prescription sent electronically to a different pharmacy, notify our office through [facility] or by phone at [phone]

## 2023-11-14 NOTE — Progress Notes (Signed)
   Follow-Up Visit   Subjective  Alyssa Baker is a 53 y.o. female who presents for the following: Ezcema flare up . Patient has been taking Dupixent and took her last dose on 11/11/23.   Patient present today for follow up visit for ezcema flare up. Patient was last evaluated on [id number]. At this visit patient was prescribed Dupixent . Patient reports sxs are better. Patient denies medication changes.  Patient started Dupixent in January, approximately 2 months ago. Reports improvement in feet and back symptoms. However, a new area of concern has developed on the shins, which has not been treated yet. The patient describes symptoms of pain, aching, and burning in the shins, but denies itching. The patient used Cetaphil lotion after shaving and Skintimate shaving lotion, but no new products or razor blades were introduced. Denies recent illness, fevers, chills, coughs, or dietary changes. Ice packs provide some relief for the ache and burn. The patient has not applied any treatments to the affected area due to concerns about potential worsening.   The following portions of the chart were reviewed this encounter and updated as appropriate: medications, allergies, medical history  Review of Systems:  No other skin or systemic complaints except as noted in HPI or Assessment and Plan.  Objective  Well appearing patient in no apparent distress; mood and affect are within normal limits.  A full examination was performed including scalp, head, eyes, ears, nose, lips, neck, chest, axillae, abdomen, back, buttocks, bilateral upper extremities, bilateral lower extremities, hands, feet, fingers, toes, fingernails, and toenails. All findings within normal limits unless otherwise noted below.   A focused examination was performed of the following areas: Legs  Relevant exam findings are noted in the Assessment and Plan.        Assessment & Plan   1. Palpable purpura / Irritant Dermatitis on shins -  Assessment: New areas of concern on shins, described as palpable purpura with characteristics suggestive of vasculitis. Physical examination revealed palpable pruritic papules on anterior shins, non-blanchable, mildly irritated, and not extremely itchy. Differential diagnoses include vasculitis and irritant dermatitis from shaving. Onset of symptoms occurred after recent Dupixent injection, but initial symptoms appeared before injection, making drug reaction to Dupixent less likely.  - Plan:    Apply Bryhali cream (topical steroid) twice daily for up to 3 weeks    Mix Bryhali with Aquaphor or Vaseline if needed to reduce stinging and create a barrier    If not resolved in 3 weeks, take a 2-week break before resuming treatment    Switch to Aveeno shaving gel and Gillette Mach5 Fusion razor for sensitive skin    Avoid applying lotions immediately after shaving    Monitor for any changes, especially after next Dupixent injection    Follow up as scheduled in April    Send MyTrip message in 2 weeks to report progress  2. Atopic dermatitis - Assessment: Patient started Dupixent in January for atopic dermatitis. After 2 months of treatment, improvement noted in feet and back areas. Full effect of Dupixent may take 3-4 months, up to 6 months.  - Plan:    Continue Dupixent as prescribed    Follow up as scheduled to assess treatment efficacy    Return for as scheduled.Samara Deist, CMA, am acting as scribe for [employer], DO.   Documentation: I have reviewed the above documentation for accuracy and completeness, and I agree with the above.  Langston Reusing, DO

## 2023-11-25 ENCOUNTER — Encounter: Payer: Self-pay | Admitting: Dermatology

## 2023-12-19 ENCOUNTER — Encounter: Payer: Self-pay | Admitting: Dermatology

## 2023-12-19 ENCOUNTER — Ambulatory Visit (INDEPENDENT_AMBULATORY_CARE_PROVIDER_SITE_OTHER): Payer: BC Managed Care – PPO | Admitting: Dermatology

## 2023-12-19 VITALS — BP 133/87 | HR 69

## 2023-12-19 DIAGNOSIS — Z79899 Other long term (current) drug therapy: Secondary | ICD-10-CM | POA: Diagnosis not present

## 2023-12-19 DIAGNOSIS — L209 Atopic dermatitis, unspecified: Secondary | ICD-10-CM

## 2023-12-19 NOTE — Patient Instructions (Addendum)
 Hello Marleen Silvius,  Thank you for visiting today. Here is a summary of the key instructions:  Medications: - Continue using Dupixent for 3 more months (total 6 months of treatment) before we attempt to change to a different systemic treatment - Use Zoryve cream 1-2 times a day  Lifestyle Changes: - Follow an anti-inflammatory diet:   - Cut out sugar and simple carbs   - Minimize red meat   - Drink more water    - Avoid soft drinks and juice   - Eat green leafy vegetables, blueberries, and raspberries   - Increase antioxidants  Foot Care: - You can have a pedicure - Avoid pumice stones and scrubbing the bottom of your feet  Follow-up: - Return for a follow-up appointment in 3 months  Please reach out if you have any questions or concerns.  Warm regards,  Dr. Louana Roup, Dermatology      Important Information   Due to recent changes in healthcare laws, you may see results of your pathology and/or laboratory studies on MyChart before the doctors have had a chance to review them. We understand that in some cases there may be results that are confusing or concerning to you. Please understand that not all results are received at the same time and often the doctors may need to interpret multiple results in order to provide you with the best plan of care or course of treatment. Therefore, we ask that you please give us  2 business days to thoroughly review all your results before contacting the office for clarification. Should we see a critical lab result, you will be contacted sooner.     If You Need Anything After Your Visit   If you have any questions or concerns for your doctor, please call our main line at [phone]. If no one answers, please leave a voicemail as directed and we will return your call as soon as possible. Messages left after 4 pm will be answered the following business day.    You may also send us  a message via MyChart. We typically respond to MyChart messages  within 1-2 business days.  For prescription refills, please ask your pharmacy to contact our office. Our fax number is [phone].  If you have an urgent issue when the clinic is closed that cannot wait until the next business day, you can page your doctor at the number below.     Please note that while we do our best to be available for urgent issues outside of office hours, we are not available 24/7.    If you have an urgent issue and are unable to reach us , you may choose to seek medical care at your doctor's office, retail clinic, urgent care center, or emergency room.   If you have a medical emergency, please immediately call 911 or go to the emergency department. In the event of inclement weather, please call our main line at [phone] for an update on the status of any delays or closures.  Dermatology Medication Tips: Please keep the boxes that topical medications come in in order to help keep track of the instructions about where and how to use these. Pharmacies typically print the medication instructions only on the boxes and not directly on the medication tubes.   If your medication is too expensive, please contact our office at [phone] or send us  a message through MyChart.    We are unable to tell what your co-pay for medications will be in advance as this is different  depending on your insurance coverage. However, we may be able to find a substitute medication at lower cost or fill out paperwork to get insurance to cover a needed medication.    If a prior authorization is required to get your medication covered by your insurance company, please allow us  1-2 business days to complete this process.   Drug prices often vary depending on where the prescription is filled and some pharmacies may offer cheaper prices.   The website www.goodrx.com contains coupons for medications through different pharmacies. The prices here do not account for what the cost may be with help from  insurance (it may be cheaper with your insurance), but the website can give you the price if you did not use any insurance.  - You can print the associated coupon and take it with your prescription to the pharmacy.  - You may also stop by our office during regular business hours and pick up a GoodRx coupon card.  - If you need your prescription sent electronically to a different pharmacy, notify our office through [facility] or by phone at [phone]

## 2023-12-19 NOTE — Progress Notes (Signed)
   Follow-Up Visit   Subjective  Alyssa Baker is a 53 y.o. female who presents for the following: Eczema  Patient present today for follow up visit for Eczema. Patient was last evaluated on 11/14/23. At this visit patient was prescribed Bryhali  cream (topical steroid) twice daily for up to 3 weeks to contact dermatitst on B/L Shin from [place]. Patient reports sxs are better. Patient denies medication changes.  Patient states she is tolerating Dupixent fairly well but it has not helped with Foot Eczema. Her last injection date 12/09/23. She is current applying  Bryhali  on the feet. She started about 2 days ago and has not noticed any improvement.    The following portions of the chart were reviewed this encounter and updated as appropriate: medications, allergies, medical history  Review of Systems:  No other skin or systemic complaints except as noted in HPI or Assessment and Plan.  Objective  Well appearing patient in no apparent distress; mood and affect are within normal limits.  A focused examination was performed of the following areas: B/L LE  Relevant exam findings are noted in the Assessment and Plan.         Assessment & Plan   ATOPIC DERMATITIS ? Psoriasis Exam: Scaly pink papules coalescing to plaques 5% BSA, IGA:3  Flared  Atopic dermatitis (eczema) is a chronic, relapsing, pruritic condition that can significantly affect quality of life. It is often associated with allergic rhinitis and/or asthma and can require treatment with topical medications, phototherapy, or in severe cases biologic injectable medication (Dupixent; Adbry) or Oral JAK inhibitors.  - Assessment: Patient on Dupixent for 3.5 months with mixed results. Recent flare-ups on chin and back have resolved. Eczema on feet persists and has worsened since January, with increased redness and inflammation, particularly on the left foot. Patient reports cessation of scratching, but the area remains active and  inflamed. Recent biopsy results leaned towards eczema rather than psoriasis. Current treatment with Zoryve (non-steroid) initiated a couple of days ago has not shown improvement. Dupixent treatment requires a full six-month trial before reassessing efficacy.  - Plan:    Continue Dupixent for additional 2.5 months to complete 42-month trial    Add Zoryve cream once daily to affected areas     - Provided samples to assess efficacy    Continue Zoryve twice daily as recently prescribed    Recommend anti-inflammatory diet:     - Reduce sugar, simple carbohydrates, and red meat intake     - Increase water  consumption     - Avoid soft drinks and juice     - Increase intake of green leafy vegetables, blueberries, and raspberries     - Increase antioxidant-rich foods    Avoid pumice stones and scrubbing of affected areas during pedicures    Follow-up appointment in 3 months    If condition not clear in 3 months, consider changing diagnosis clinically without additional biopsy ATOPIC DERMATITIS, UNSPECIFIED TYPE   Related Medications Dupilumab (DUPIXENT) 300 MG/2ML SOAJ Inject 300 mg into the skin every 14 (fourteen) days. Starting at day 15 for maintenance.  Return in about 3 months (around 03/19/2024) for Eczema F/U.  I, Jetta Ager, am acting as Neurosurgeon for [employer], DO.  Documentation: I have reviewed the above documentation for accuracy and completeness, and I agree with the above.  Louana Roup, DO

## 2024-01-22 ENCOUNTER — Encounter: Payer: Self-pay | Admitting: Physician Assistant

## 2024-03-10 NOTE — Progress Notes (Unsigned)
 03/12/2024 Alyssa Baker [id number] 01/06/1971  Referring provider: Gretta Comer POUR, NP Primary GI doctor: Dr. Suzann (Dr. Aneita remotely)  ASSESSMENT AND PLAN:  Abdominal pain with diarrhea, previous history of constipation 04/2009 colonoscopy for abdominal pain showed torturous redundant colon excellent prep with movie prep, normal TI use pediatric scope no polyps recall 10 years Loose stools, gas, borborygmus, bloating, x years, 2-6 x a day, foamy yellow liquid occ with passing gas, worse with eating, no nocturnal symptoms, no weight loss - KUB with history of constipation - ESR and CRP to screen for IBD, consider fecal cal - Giardia testing, no recent ABX, has been years with diarrhea - TSH - IgA tissue transglutaminase, IgA level to test for celiac disease - chromogranin A - SIBO testing - Avoid milk products, raw fruits, raw vegetables, high fat foods, artifical sweeteners, and carbonated beverages until symptoms resolve. Given FODMAP -Continue hydration with Pedialyte and electrolyte solutions  - EGD with duodenal biopsies - Colonoscopy with random biopsies and evaluation of the terminal ileum, will need peds scope  Dysphagia, inconsistent with occ nausea, no vomiting but has dry heaving, has increased in mucus Denies GERD but symptoms may be consistent with LPR Will schedule EGD with colon to evaluate diarrhea Given information, check celiac I discussed risks of EGD with patient today, including risk of sedation, bleeding or perforation.  Patient provides understanding and gave verbal consent to proceed.  B12 deficiency x 14 years 2018 B12 of 150, has not been checked againg Not responsive to injection or sublingual therapy, not on anything at this time Will recheck B12, antiparietal and intrinsic antibodies, homocysteine, MMA Check folate, iron,ferritin,  Check SIBO Schedule EGD  I have reviewed the clinic note as outlined by Alan Coombs, PA and agree with the  assessment, plan and medical decision making.  Alyssa Baker returns to the office for follow-up of abdominal pain, diarrhea, bloating, GERD and vitamin B12 deficiency.  She is overdue for screening colonoscopy and agree with proceeding with colonoscopy and obtaining biopsies to rule out microscopic colitis.  EGD will be performed at the same time for investigation of dysphagia with biopsies for celiac disease.  Agree with lab testing ordered by APP.  Inocente Suzann, MD   Patient Care Team: Gretta Comer POUR, NP as PCP - General (Nurse Practitioner)  HISTORY OF PRESENT ILLNESS: 53 y.o. female with a past medical history listed below presents for evaluation of chronic diarrhea.   Discussed the use of AI scribe software for clinical note transcription with the patient, who gave verbal consent to proceed.  History of Present Illness   Alyssa Baker is a 53 year old female with B12 deficiency who presents with severe diarrhea and gastrointestinal symptoms.  Over the past few years, she has transitioned from constant constipation to severe diarrhea. The diarrhea is frequent, gassy, watery, and sometimes violent, occurring 2 to 6 times a day. Episodes of passing gas result in small amounts of foamy, fuzzy stool. There is no fecal incontinence, but occasional yellow liquid stools and loose stools are noted. Symptoms worsen with food intake, though no specific triggers are identified. She experiences severe pain and cramps in the lower abdomen and rectum, with embarrassing gurgling sounds during conversations.  Significant bloating and gas cause pain and discomfort. She denies nighttime symptoms, weight loss, or heartburn but reports occasional nausea, especially in the mornings, leading to dry heaving. She sometimes has trouble swallowing, feeling like food gets stuck, requiring her to cough it up.  She also feels full very quickly when eating, which is unusual for her.  She has a history of B12 deficiency  diagnosed in 2018, with levels as low as 94, 276, 177, and 150 over the years. Despite treatments with injections, dissolvables, and tablets, her B12 levels have not improved.  She notes a history of blood in the stool about two weeks ago, initially thought to be from a vessel but later appeared more significant. No recent antibiotics, fevers, chills, or dark black stool. She reports rectal pain, burning, and itching.  Her social history includes daily alcohol consumption, specifically 2 to 4 glasses of wine, and she works as a Emergency planning/management officer for a [employer]. She has a family history of gastrointestinal issues, though no specific conditions like Crohn's or ulcerative colitis are noted. She is currently on Dupixent  injections for eczema.      She  reports that she has never smoked. She has never used smokeless tobacco. She reports current alcohol use. She reports that she does not use drugs.  RELEVANT GI HISTORY, IMAGING AND LABS: Results   LABS Vitamin B12: 94 (2011) Vitamin B12: 276 (2018) Vitamin B12: 177 (2019) Vitamin B12: 150 (2019)  DIAGNOSTIC Colonoscopy: Normal, redundant tortuous colon, pediatric scope used (04/2019)      CBC    Component Value Date/Time   WBC 7.8 07/10/2017 1213   RBC 3.83 (L) 07/10/2017 1213   HGB 12.8 07/10/2017 1213   HCT 38.2 07/10/2017 1213   PLT 277.0 07/10/2017 1213   MCV 99.7 07/10/2017 1213   MCHC 33.6 07/10/2017 1213   RDW 12.7 07/10/2017 1213   LYMPHSABS 0.6 (L) 05/16/2015 1445   MONOABS 0.6 05/16/2015 1445   EOSABS 0.0 05/16/2015 1445   BASOSABS 0.0 05/16/2015 1445   No results for input(s): HGB in the last 8760 hours.  CMP     Component Value Date/Time   NA 139 09/10/2016 0928   K 5.0 09/10/2016 0928   CL 106 09/10/2016 0928   CO2 27 09/10/2016 0928   GLUCOSE 83 09/10/2016 0928   BUN 14 09/10/2016 0928   CREATININE 0.69 09/10/2016 0928   CREATININE 0.59 09/09/2015 1600   CALCIUM 9.4 09/10/2016 0928   PROT 7.6  09/10/2016 0928   ALBUMIN 4.3 09/10/2016 0928   AST 19 09/10/2016 0928   ALT 20 09/10/2016 0928   ALKPHOS 42 09/10/2016 0928   BILITOT 0.5 09/10/2016 0928   GFRNONAA 101 03/12/2007 0000   GFRAA 122 03/12/2007 0000      Latest Ref Rng & Units 09/10/2016    9:28 AM 05/16/2015    2:45 PM 04/01/2012   11:47 AM  Hepatic Function  Total Protein 6.0 - 8.3 g/dL 7.6  7.6  8.0   Albumin 3.5 - 5.2 g/dL 4.3  4.2  4.4   AST 0 - 37 U/L 19  19  18    ALT 0 - 35 U/L 20  11  13    Alk Phosphatase 39 - 117 U/L 42  57  43   Total Bilirubin 0.2 - 1.2 mg/dL 0.5  0.4  0.8   Bilirubin, Direct 0.0 - 0.3 mg/dL   0.1       Current Medications:     Current Outpatient Medications (Respiratory):    Fluticasone  Furoate (FLONASE SENSIMIST NA), Flonase Sensimist    Current Outpatient Medications (Other):    AMBULATORY NON FORMULARY MEDICATION, Take 2 tablets by mouth in the morning and at bedtime. Medication Name: Visical hair supplement   Dupilumab  (  DUPIXENT ) 300 MG/2ML SOAJ, Inject 300 mg into the skin every 14 (fourteen) days. Starting at day 15 for maintenance.   Na Sulfate-K Sulfate-Mg Sulfate concentrate (SUPREP BOWEL PREP KIT) 17.5-3.13-1.6 GM/177ML SOLN, Take 1 kit (354 mLs total) by mouth once for 1 dose.  Medical History:  Past Medical History:  Diagnosis Date   Abdominal pain, recurrent    PMH of   Anxiety    Carpal tunnel syndrome, right    Depression    Fatigue    Hypertriglyceridemia    Iron deficiency anemia    Vitamin B12 deficiency    Vitamin D  deficiency    Allergies:  Allergies  Allergen Reactions   Hycodan [Hydrocodone  Bit-Homatrop Mbr] Itching and Swelling    Itching and gum swelling   Penicillins     Acute airway compromise   Lamisil  [Terbinafine ] Hives    Metallic, lost taste in mouth, GI issues   Red Dye #28 (Non-Screening) Other (See Comments)     Surgical History:  She  has a past surgical history that includes Skull fracture elevation (2006); Colonoscopy  (04/2009); Essure tubal ligation; and Cystoscopy with biopsy (N/A, 10/06/2019). Family History:  Her family history includes Breast cancer in her maternal aunt; CVA in her mother; Colon polyps in her mother.  REVIEW OF SYSTEMS  : All other systems reviewed and negative except where noted in the History of Present Illness.  PHYSICAL EXAM: BP 126/76   Pulse 85   Ht 5' 8 (1.727 m)   Wt 155 lb (70.3 kg)   LMP 05/18/2021   BMI 23.57 kg/m  Physical Exam   GENERAL APPEARANCE: Well nourished, in no apparent distress. HEENT: No cervical lymphadenopathy, unremarkable thyroid, sclerae anicteric, conjunctiva pink. RESPIRATORY: Respiratory effort normal, breath sounds equal bilaterally without rales, rhonchi, or wheezing. Lungs clear to auscultation bilaterally. CARDIO: Regular rate and rhythm with no murmurs, rubs, or gallops, peripheral pulses intact. ABDOMEN: Soft, non-distended, active bowel sounds in all four quadrants, mild tenderness in the right lower quadrant, no rebound, no mass appreciated. RECTAL: Declines. MUSCULOSKELETAL: Full range of motion, normal gait, without edema. SKIN: Dry, intact without rashes or lesions. No jaundice. NEURO: Alert, oriented, no focal deficits. PSYCH: Cooperative, normal mood and affect.      Alan JONELLE Coombs, PA-C 11:05 AM

## 2024-03-12 ENCOUNTER — Other Ambulatory Visit (INDEPENDENT_AMBULATORY_CARE_PROVIDER_SITE_OTHER)

## 2024-03-12 ENCOUNTER — Ambulatory Visit (INDEPENDENT_AMBULATORY_CARE_PROVIDER_SITE_OTHER)
Admission: RE | Admit: 2024-03-12 | Discharge: 2024-03-12 | Disposition: A | Discharge status: Home / Self Care | Source: Ambulatory Visit | Attending: Physician Assistant | Admitting: Physician Assistant

## 2024-03-12 ENCOUNTER — Ambulatory Visit: Admitting: Physician Assistant

## 2024-03-12 ENCOUNTER — Encounter: Payer: Self-pay | Admitting: Physician Assistant

## 2024-03-12 ENCOUNTER — Ambulatory Visit: Payer: Self-pay | Admitting: Physician Assistant

## 2024-03-12 VITALS — BP 126/76 | HR 85 | Ht 68.0 in | Wt 155.0 lb

## 2024-03-12 DIAGNOSIS — R142 Eructation: Secondary | ICD-10-CM | POA: Diagnosis not present

## 2024-03-12 DIAGNOSIS — R141 Gas pain: Secondary | ICD-10-CM

## 2024-03-12 DIAGNOSIS — E538 Deficiency of other specified B group vitamins: Secondary | ICD-10-CM

## 2024-03-12 DIAGNOSIS — R1310 Dysphagia, unspecified: Secondary | ICD-10-CM

## 2024-03-12 DIAGNOSIS — R198 Other specified symptoms and signs involving the digestive system and abdomen: Secondary | ICD-10-CM

## 2024-03-12 DIAGNOSIS — R140 Abdominal distension (gaseous): Secondary | ICD-10-CM | POA: Diagnosis not present

## 2024-03-12 DIAGNOSIS — R143 Flatulence: Secondary | ICD-10-CM

## 2024-03-12 DIAGNOSIS — R197 Diarrhea, unspecified: Secondary | ICD-10-CM | POA: Diagnosis not present

## 2024-03-12 DIAGNOSIS — K529 Noninfective gastroenteritis and colitis, unspecified: Secondary | ICD-10-CM | POA: Diagnosis not present

## 2024-03-12 DIAGNOSIS — E7211 Homocystinuria: Secondary | ICD-10-CM | POA: Diagnosis not present

## 2024-03-12 DIAGNOSIS — R1319 Other dysphagia: Secondary | ICD-10-CM

## 2024-03-12 DIAGNOSIS — R110 Nausea: Secondary | ICD-10-CM

## 2024-03-12 LAB — CBC WITH DIFFERENTIAL/PLATELET
Basophils Absolute: 0 K/uL (ref 0.0–0.1)
Basophils Relative: 0.5 % (ref 0.0–3.0)
Eosinophils Absolute: 0.1 K/uL (ref 0.0–0.7)
Eosinophils Relative: 1.1 % (ref 0.0–5.0)
HCT: 39.6 % (ref 36.0–46.0)
Hemoglobin: 13.3 g/dL (ref 12.0–15.0)
Lymphocytes Relative: 20.3 % (ref 12.0–46.0)
Lymphs Abs: 1.8 K/uL (ref 0.7–4.0)
MCHC: 33.5 g/dL (ref 30.0–36.0)
MCV: 104.6 fl — ABNORMAL HIGH (ref 78.0–100.0)
Monocytes Absolute: 0.6 K/uL (ref 0.1–1.0)
Monocytes Relative: 7 % (ref 3.0–12.0)
Neutro Abs: 6.2 K/uL (ref 1.4–7.7)
Neutrophils Relative %: 71.1 % (ref 43.0–77.0)
Platelets: 271 K/uL (ref 150.0–400.0)
RBC: 3.79 Mil/uL — ABNORMAL LOW (ref 3.87–5.11)
RDW: 14.2 % (ref 11.5–15.5)
WBC: 8.7 K/uL (ref 4.0–10.5)

## 2024-03-12 LAB — IBC + FERRITIN
Ferritin: 65 ng/mL (ref 10.0–291.0)
Iron: 102 ug/dL (ref 42–145)
Saturation Ratios: 29.9 % (ref 20.0–50.0)
TIBC: 341.6 ug/dL (ref 250.0–450.0)
Transferrin: 244 mg/dL (ref 212.0–360.0)

## 2024-03-12 LAB — COMPREHENSIVE METABOLIC PANEL WITH GFR
ALT: 12 U/L (ref 0–35)
AST: 20 U/L (ref 0–37)
Albumin: 4.5 g/dL (ref 3.5–5.2)
Alkaline Phosphatase: 68 U/L (ref 39–117)
BUN: 8 mg/dL (ref 6–23)
CO2: 27 meq/L (ref 19–32)
Calcium: 9.4 mg/dL (ref 8.4–10.5)
Chloride: 103 meq/L (ref 96–112)
Creatinine, Ser: 0.62 mg/dL (ref 0.40–1.20)
GFR: 102.68 mL/min (ref >60.00)
Glucose, Bld: 95 mg/dL (ref 70–99)
Potassium: 3.5 meq/L (ref 3.5–5.1)
Sodium: 140 meq/L (ref 135–145)
Total Bilirubin: 0.6 mg/dL (ref 0.2–1.2)
Total Protein: 7.9 g/dL (ref 6.0–8.3)

## 2024-03-12 LAB — C-REACTIVE PROTEIN: CRP: <1 mg/dL (ref 0.5–20.0)

## 2024-03-12 LAB — VITAMIN B12: Vitamin B-12: 80 pg/mL — ABNORMAL LOW (ref 211–911)

## 2024-03-12 LAB — SEDIMENTATION RATE: Sed Rate: 12 mm/h (ref 0–30)

## 2024-03-12 LAB — FOLATE: Folate: 4.5 ng/mL — ABNORMAL LOW (ref >5.9)

## 2024-03-12 MED ORDER — CYANOCOBALAMIN 1000 MCG/ML IJ SOLN: INTRAMUSCULAR | 0 refills | Status: DC

## 2024-03-12 MED ORDER — FOLIC ACID 1 MG PO TABS: 1.0000 mg | ORAL_TABLET | Freq: Every day | ORAL | 3 refills | Status: DC

## 2024-03-12 MED ORDER — NA SULFATE-K SULFATE-MG SULF 17.5-3.13-1.6 GM/177ML PO SOLN: 1.0000 | Freq: Once | ORAL | 0 refills | Status: AC

## 2024-03-12 NOTE — Patient Instructions (Addendum)
 Your provider has requested that you go to the basement level for lab work before leaving today. Press B on the elevator. The lab is located at the first door on the left as you exit the elevator.  Your provider has requested that you have an abdominal x ray before leaving today. Please go to the basement floor to our Radiology department for the test.   First do a trial off milk/lactose products if you use them.  Add fiber like benefiber or citracel once a day Increase activity Can do trial of IBGard which is over the counter for AB pain- Take 1-2 capsules once a day for maintence or twice a day during a flare    FODMAP stands for fermentable oligo-, di-, mono-saccharides and polyols (1). These are the scientific terms used to classify groups of carbs that are difficult for our body to digest and that are notorious for triggering digestive symptoms like bloating, gas, loose stools and stomach pain.   You can try low FODMAP diet  - start with eliminating just one column at a time that you feel may be a trigger for you. - the table at the very bottom contains foods that are low in FODMAPs   Sometimes trying to eliminate the FODMAP's from your diet is difficult or tricky, if you are stuggling with trying to do the elimination diet you can try an enzyme.  There is a food enzymes that you sprinkle in or on your food that helps break down the FODMAP. You can read more about the enzyme by going to this site: https://fodzyme.com/   You have been given a testing kit to check for small intestine bacterial overgrowth (SIBO) which is completed by a company named [facility]. Make sure to return your test in the mail using the return mailing label given to you along with the kit. The test order, your demographic and insurance information have all already been sent to the company. [facility] will collect an upfront charge of $109.00 for commercial insurance plans and $229.00 if you are paying  cash. The potential remaining total after claim submission and review is $120.00. Make sure to discuss with [facility] PRIOR to having the test to see if they have gotten information from your insurance company as to how much your testing will cost out of pocket, if any. Please contact [facility] at phone number [phone] to get instructions regarding how to perform the test as our office is unable to give specific testing instructions.    Small intestinal bacterial overgrowth (SIBO) occurs when there is an abnormal increase in the overall bacterial population in the small intestine -- particularly types of bacteria not commonly found in that part of the digestive tract. Small intestinal bacterial overgrowth (SIBO) commonly results when a circumstance -- such as surgery or disease -- slows the passage of food and waste products in the digestive tract, creating a breeding ground for bacteria.    Signs and symptoms of SIBO often include: Loss of appetite Abdominal pain Nausea Bloating An uncomfortable feeling of fullness after eating Diarrhea or constipation, depending on the type of gas produced  What foods trigger SIBO? While foods aren't the original cause of SIBO, certain foods do encourage the overgrowth of the wrong bacteria in your small intestine. If you're feeding them their favorite foods, they're going to grow more, and that will trigger more of your SIBO symptoms. By the same token, you can help reduce the overgrowth by starving the problematic bacteria of their  favorite foods. This strategy has led to a number of proposed SIBO eating plans. The plans vary, and so do individual results. But in general, they tend to recommend limiting carbohydrates.  These include: Sugars and sweeteners. Fruits and starchy vegetables. Dairy products. Grains.  There is a test for this we can do called a breath test, if you are positive we will treat you with an antibiotic to see if it  helps.  Your symptoms are very suspicious for this condition, as discussed, we will start you on an antibiotic to see if this helps.   B12 is mainly in meat, so increase meat can help this but often people have a deficiency that they are just not absorbing it well with meat or pills, so get the sublingual/melt in your mouth one.   If the sublingual one dose not increase your level, we will discuss shots.   Vitamin B12 Deficiency Vitamin B12 deficiency occurs when the body does not have enough vitamin B12, which is an important vitamin. The body needs this vitamin: To make red blood cells. To make DNA. This is the genetic material inside cells. To help the nerves work properly so they can carry messages from the brain to the body. Vitamin B12 deficiency can cause various health problems, such as a low red blood cell count (anemia) or nerve damage. What are the causes? This condition may be caused by: Not eating enough foods that contain vitamin B12. Not having enough stomach acid and digestive fluids to properly absorb vitamin B12 from the food that you eat. Certain digestive system diseases that make it hard to absorb vitamin B12. These diseases include Crohn's disease, chronic pancreatitis, and cystic fibrosis. A condition in which the body does not make enough of a protein (intrinsic factor), resulting in too few red blood cells (pernicious anemia). Having a surgery in which part of the stomach or small intestine is removed. Taking certain medicines that make it hard for the body to absorb vitamin B12. These medicines include: Heartburn medicines (antacids and proton pump inhibitors). Certain antibiotic medicines. Some medicines that are used to treat diabetes, tuberculosis, gout, or high cholesterol. What increases the risk? The following factors may make you more likely to develop a B12 deficiency: Being older than age 91. Eating a vegetarian or vegan diet, especially while you are  pregnant. Eating a poor diet while you are pregnant. Taking certain medicines. Having alcoholism. What are the signs or symptoms? In some cases, there are no symptoms of this condition. If the condition leads to anemia or nerve damage, various symptoms can occur, such as: Weakness. Fatigue. Loss of appetite. Weight loss. Numbness or tingling in your hands and feet. Redness and burning of the tongue. Confusion or memory problems. Depression. Sensory problems, such as color blindness, ringing in the ears, or loss of taste. Diarrhea or constipation. Trouble walking. If anemia is severe, symptoms can include: Shortness of breath. Dizziness. Rapid heart rate (tachycardia). How is this diagnosed? This condition may be diagnosed with a blood test to measure the level of vitamin B12 in your blood. You may also have other tests, including: A group of tests that measure certain characteristics of blood cells (complete blood count, CBC). A blood test to measure intrinsic factor. A procedure where a thin tube with a camera on the end is used to look into your stomach or intestines (endoscopy). Other tests may be needed to discover the cause of B12 deficiency. How is this treated? Treatment for this  condition depends on the cause. This condition may be treated by: Changing your eating and drinking habits, such as: Eating more foods that contain vitamin B12. Drinking less alcohol or no alcohol. Getting vitamin B12 injections. Taking vitamin B12 supplements. Your health care provider will tell you which dosage is best for you. Follow these instructions at home: Eating and drinking  Eat lots of healthy foods that contain vitamin B12, including: Meats and poultry. This includes beef, pork, chicken, [place], and organ meats, such as liver. Seafood. This includes clams, rainbow trout, salmon, tuna, and haddock. Eggs. Cereal and dairy products that are fortified. This means that vitamin B12 has  been added to the food. Check the label on the package to see if the food is fortified. The items listed above may not be a complete list of recommended foods and beverages. Contact a dietitian for more information. General instructions Get any injections that are prescribed by your health care provider. Take supplements only as told by your health care provider. Follow the directions carefully. Do not drink alcohol if your health care provider tells you not to. In some cases, you may only be asked to limit alcohol use. Keep all follow-up visits as told by your health care provider. This is important. Contact a health care provider if: Your symptoms come back. Get help right away if you: Develop shortness of breath. Have a rapid heart rate. Have chest pain. Become dizzy or lose consciousness. Summary Vitamin B12 deficiency occurs when the body does not have enough vitamin B12. The main causes of vitamin B12 deficiency include dietary deficiency, digestive diseases, pernicious anemia, and having a surgery in which part of the stomach or small intestine is removed. In some cases, there are no symptoms of this condition. If the condition leads to anemia or nerve damage, various symptoms can occur, such as weakness, shortness of breath, and numbness. Treatment may include getting vitamin B12 injections or taking vitamin B12 supplements. Eat lots of healthy foods that contain vitamin B12. This information is not intended to replace advice given to you by your health care provider. Make sure you discuss any questions you have with your health care provider. Document Revised: 01/30/2019 Document Reviewed: 04/22/2018 Elsevier Patient Education  2020 [employer].  You have been scheduled for an endoscopy and colonoscopy. Please follow the written instructions given to you at your visit today.  If you use inhalers (even only as needed), please bring them with you on the day of your procedure.  DO  NOT TAKE 7 DAYS PRIOR TO TEST- Trulicity (dulaglutide) Ozempic, Wegovy (semaglutide) Mounjaro (tirzepatide) Bydureon Bcise (exanatide extended release)  DO NOT TAKE 1 DAY PRIOR TO YOUR TEST Rybelsus (semaglutide) Adlyxin (lixisenatide) Victoza (liraglutide) Byetta (exanatide) ___________________________________________________________________________   Due to recent changes in healthcare laws, you may see the results of your imaging and laboratory studies on MyChart before your provider has had a chance to review them.  We understand that in some cases there may be results that are confusing or concerning to you. Not all laboratory results come back in the same time frame and the provider may be waiting for multiple results in order to interpret others.  Please give us  48 hours in order for your provider to thoroughly review all the results before contacting the office for clarification of your results.    I appreciate the  opportunity to care for you  Thank You   [facility]

## 2024-03-13 ENCOUNTER — Other Ambulatory Visit

## 2024-03-14 LAB — CHROMOGRANIN A: Chromogranin A (ng/mL): 33.2 ng/mL (ref 0.0–101.8)

## 2024-03-16 LAB — INTRINSIC FACTOR ANTIBODIES: Intrinsic Factor: NEGATIVE

## 2024-03-16 LAB — GIARDIA AND CRYPTOSPORIDIUM ANTIGEN PANEL
MICRO NUMBER:: 16717520
RESULT:: NOT DETECTED
SPECIMEN QUALITY:: ADEQUATE
Specimen Quality:: ADEQUATE
micro Number:: 16717519

## 2024-03-16 LAB — IGA: Immunoglobulin A: 324 mg/dL — ABNORMAL HIGH (ref 47–310)

## 2024-03-16 LAB — HOMOCYSTEINE: Homocysteine: 83.9 umol/L — ABNORMAL HIGH (ref <=13.4)

## 2024-03-16 LAB — ANTI-PARIETAL ANTIBODY: PARIETAL CELL AB SCREEN: NEGATIVE

## 2024-03-16 LAB — TISSUE TRANSGLUTAMINASE, IGA: (tTG) Ab, IgA: 1.2 U/mL

## 2024-03-16 LAB — METHYLMALONIC ACID, SERUM: Methylmalonic Acid, Quant: 194 nmol/L (ref 55–335)

## 2024-03-20 ENCOUNTER — Ambulatory Visit

## 2024-03-20 DIAGNOSIS — E538 Deficiency of other specified B group vitamins: Secondary | ICD-10-CM

## 2024-03-20 MED ORDER — CYANOCOBALAMIN 1000 MCG/ML IJ SOLN
1000.0000 ug | Freq: Once | INTRAMUSCULAR | Status: AC
Start: 2024-03-20 — End: 2024-03-20
  Administered 2024-03-20: 1000 ug via INTRAMUSCULAR

## 2024-03-23 ENCOUNTER — Ambulatory Visit: Admitting: Dermatology

## 2024-03-27 ENCOUNTER — Ambulatory Visit

## 2024-03-27 DIAGNOSIS — E538 Deficiency of other specified B group vitamins: Secondary | ICD-10-CM

## 2024-03-27 MED ORDER — CYANOCOBALAMIN 1000 MCG/ML IJ SOLN
1000.0000 ug | Freq: Once | INTRAMUSCULAR | Status: AC
  Administered 2024-03-27: 1000 ug via INTRAMUSCULAR

## 2024-03-31 ENCOUNTER — Telehealth: Payer: Self-pay | Admitting: Physician Assistant

## 2024-03-31 DIAGNOSIS — K580 Irritable bowel syndrome with diarrhea: Secondary | ICD-10-CM

## 2024-03-31 MED ORDER — RIFAXIMIN 550 MG PO TABS: 550.0000 mg | ORAL_TABLET | Freq: Three times a day (TID) | ORAL | 0 refills | Status: AC

## 2024-03-31 MED ORDER — RIFAXIMIN 550 MG PO TABS
550.0000 mg | ORAL_TABLET | Freq: Three times a day (TID) | ORAL | 0 refills | Status: DC
Start: 2024-03-31 — End: 2024-03-31

## 2024-03-31 NOTE — Telephone Encounter (Signed)
 SIBO testing shows increase in hydrogen 29 ppm, methane 5 ppm and increase in combined 34 ppm which suggest bacterial overgrowth is suspected.  Patient with IBS with diarrhea, unable to tolerate Viberzi. Will send in Xifaxan  for treatment

## 2024-04-01 ENCOUNTER — Encounter: Payer: Self-pay | Admitting: Pediatrics

## 2024-04-03 ENCOUNTER — Ambulatory Visit (INDEPENDENT_AMBULATORY_CARE_PROVIDER_SITE_OTHER)

## 2024-04-03 ENCOUNTER — Ambulatory Visit

## 2024-04-03 ENCOUNTER — Other Ambulatory Visit: Payer: Self-pay | Admitting: Physician Assistant

## 2024-04-03 DIAGNOSIS — E538 Deficiency of other specified B group vitamins: Secondary | ICD-10-CM | POA: Diagnosis not present

## 2024-04-03 MED ORDER — CYANOCOBALAMIN 1000 MCG/ML IJ SOLN
1000.0000 ug | Freq: Once | INTRAMUSCULAR | Status: AC
  Administered 2024-04-03: 1000 ug via INTRAMUSCULAR

## 2024-04-07 NOTE — Progress Notes (Signed)
 [facility] History and Physical   Primary Care Physician:  Gretta Comer POUR, NP   Reason for Procedure:  Dysphagia, abdominal pain, bloating, diarrhea, constipation, screening colonoscopy  Plan:    Upper endoscopy and colonoscopy     HPI: Alyssa Baker is a 53 y.o. female undergoing upper endoscopy and colonoscopy for investigation of dysphagia, abdominal pain, bloating, diarrhea, constipation and screening colonoscopy.  At the time of recent clinic visit patient reports that her bowel habits have changed from constant constipation to frequent diarrhea.  Reports frequent, gassy, watery and sometimes violent diarrhea occurring 2-6 times a day.  Also endorses gas and bloating.  Stools negative for Cryptosporidium, TTG IgA negative, intrinsic factor and antiparietal cell antibodies negative.  Noted also to have a history of chronic vitamin B12 deficiency  Patient has never had a screening colonoscopy.  There is a pertinent family history of colon polyps in the patient's mother.  No family history of colorectal cancer.   Past Medical History:  Diagnosis Date   Abdominal pain, recurrent    PMH of   Anxiety    Carpal tunnel syndrome, right    Depression    Fatigue    Hypertriglyceridemia    Iron deficiency anemia    Vitamin B12 deficiency    Vitamin D  deficiency     Past Surgical History:  Procedure Laterality Date   COLONOSCOPY  04/2009   negative except small colon   CYSTOSCOPY WITH BIOPSY N/A 10/06/2019   Procedure: CYSTOSCOPY WITH BLADDER BIOPSY/ FULGURATION 2-5 CM/ BILATERAL  RETROGRADE PYELOGRAM/ INSTILLATION OF MARCAINE /PYRIDIUM ;  Surgeon: Nieves Cough, MD;  Location: [facility];  Service: Urology;  Laterality: N/A;   ESSURE TUBAL LIGATION     SKULL FRACTURE ELEVATION  2006   Metal plates inserted (Horse accident).    Prior to Admission medications   Medication Sig Start Date End Date Taking? Authorizing Provider  AMBULATORY NON FORMULARY  MEDICATION Take 2 tablets by mouth in the morning and at bedtime. Medication Name: Visical hair supplement    [provider]  cyanocobalamin  (VITAMIN B12) 1000 MCG/ML injection Inject 1 mL (1,000 mcg total) into the skin every 30 (thirty) days. INJECT 1000MG /ML SUBCUTANEOUSLY ONCE WEEKLY FOR 4 WEEKS THEN ONCE MONTHLY AFTER THAT. 04/03/24 07/02/24  Craig Alan SAUNDERS, PA-C  Dupilumab  (DUPIXENT ) 300 MG/2ML SOAJ Inject 300 mg into the skin every 14 (fourteen) days. Starting at day 15 for maintenance. 09/16/23   Alm Delon SAILOR, DO  Fluticasone  Furoate (FLONASE SENSIMIST NA) Flonase Sensimist    [provider]  folic acid  (FOLVITE ) 1 MG tablet Take 1 tablet (1 mg total) by mouth daily. 03/12/24   Craig Alan SAUNDERS, PA-C  rifaximin  (XIFAXAN ) 550 MG TABS tablet Take 1 tablet (550 mg total) by mouth 3 (three) times daily for 14 days. 03/31/24 04/14/24  Craig Alan SAUNDERS, PA-C    Current Outpatient Medications  Medication Sig Dispense Refill   cyanocobalamin  (VITAMIN B12) 1000 MCG/ML injection Inject 1 mL (1,000 mcg total) into the skin every 30 (thirty) days. INJECT 1000MG /ML SUBCUTANEOUSLY ONCE WEEKLY FOR 4 WEEKS THEN ONCE MONTHLY AFTER THAT. 3 mL 0   Fluticasone  Furoate (FLONASE SENSIMIST NA) Flonase Sensimist     folic acid  (FOLVITE ) 1 MG tablet Take 1 tablet (1 mg total) by mouth daily. 30 tablet 3   AMBULATORY NON FORMULARY MEDICATION Take 2 tablets by mouth in the morning and at bedtime. Medication Name: Visical hair supplement     Dupilumab  (DUPIXENT ) 300 MG/2ML SOAJ Inject 300  mg into the skin every 14 (fourteen) days. Starting at day 15 for maintenance. 4 mL 10   rifaximin  (XIFAXAN ) 550 MG TABS tablet Take 1 tablet (550 mg total) by mouth 3 (three) times daily for 14 days. 42 tablet 0   Current Facility-Administered Medications  Medication Dose Route Frequency Provider Last Rate Last Admin   0.9 %  sodium chloride  infusion  500 mL Intravenous Once Fortune Torosian M, MD         Allergies as of 04/08/2024 - Review Complete 04/08/2024  Allergen Reaction Noted   Hycodan [hydrocodone  bit-homatrop mbr] Itching and Swelling 09/03/2011   Penicillins  03/12/2007   Lamisil  [terbinafine ] Hives 11/22/2022   Red dye #28 (non-screening) Other (See Comments) 12/19/2023    Family History  Problem Relation Age of Onset   CVA Mother        post TIAs   Colon polyps Mother    Breast cancer Maternal Aunt    Hypertension Neg Hx    Heart disease Neg Hx     Social History   Socioeconomic History   Marital status: Married    Spouse name: Not on file   Number of children: Not on file   Years of education: Not on file   Highest education level: Not on file  Occupational History   Not on file  Tobacco Use   Smoking status: Never   Smokeless tobacco: Never  Vaping Use   Vaping status: Never Used  Substance and Sexual Activity   Alcohol use: Yes    Comment: daily   Drug use: No   Sexual activity: Not on file  Other Topics Concern   Not on file  Social History Narrative   Married.   No children.   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     Enjoys being outdoors and riding horses.    Social Drivers of Corporate investment banker Strain: Not on file  Food Insecurity: Not on file  Transportation Needs: Not on file  Physical Activity: Not on file  Stress: Not on file  Social Connections: Not on file  Intimate Partner Violence: Not on file    Review of Systems:  All other review of systems negative except as mentioned in the HPI.  Physical Exam: Vital signs BP (!) 132/94   Pulse 78   Temp 98.4 F (36.9 C)   Resp 13   LMP 05/18/2021   SpO2 98%   General:   Alert,  Well-developed, well-nourished, pleasant and cooperative in NAD Airway:  Mallampati 1 Lungs:  Clear throughout to auscultation.   Heart:  Regular rate and rhythm; no murmurs, clicks, rubs,  or gallops. Abdomen:  Soft, nontender and  nondistended. Normal bowel sounds.   Neuro/Psych:  Normal mood and affect. A and O x 3  [name], MD [facility]

## 2024-04-08 ENCOUNTER — Ambulatory Visit: Admitting: Pediatrics

## 2024-04-08 ENCOUNTER — Encounter: Payer: Self-pay | Admitting: Pediatrics

## 2024-04-08 VITALS — BP 117/84 | HR 68 | Temp 98.4°F | Resp 12

## 2024-04-08 DIAGNOSIS — R197 Diarrhea, unspecified: Secondary | ICD-10-CM

## 2024-04-08 DIAGNOSIS — B9681 Helicobacter pylori [H. pylori] as the cause of diseases classified elsewhere: Secondary | ICD-10-CM | POA: Diagnosis not present

## 2024-04-08 DIAGNOSIS — R1310 Dysphagia, unspecified: Secondary | ICD-10-CM

## 2024-04-08 DIAGNOSIS — D122 Benign neoplasm of ascending colon: Secondary | ICD-10-CM

## 2024-04-08 DIAGNOSIS — K3189 Other diseases of stomach and duodenum: Secondary | ICD-10-CM | POA: Diagnosis not present

## 2024-04-08 DIAGNOSIS — K644 Residual hemorrhoidal skin tags: Secondary | ICD-10-CM | POA: Diagnosis not present

## 2024-04-08 DIAGNOSIS — K2950 Unspecified chronic gastritis without bleeding: Secondary | ICD-10-CM

## 2024-04-08 DIAGNOSIS — R1084 Generalized abdominal pain: Secondary | ICD-10-CM

## 2024-04-08 DIAGNOSIS — K580 Irritable bowel syndrome with diarrhea: Secondary | ICD-10-CM

## 2024-04-08 DIAGNOSIS — D124 Benign neoplasm of descending colon: Secondary | ICD-10-CM

## 2024-04-08 DIAGNOSIS — Z1211 Encounter for screening for malignant neoplasm of colon: Secondary | ICD-10-CM

## 2024-04-08 DIAGNOSIS — K5900 Constipation, unspecified: Secondary | ICD-10-CM

## 2024-04-08 DIAGNOSIS — E538 Deficiency of other specified B group vitamins: Secondary | ICD-10-CM

## 2024-04-08 DIAGNOSIS — R140 Abdominal distension (gaseous): Secondary | ICD-10-CM

## 2024-04-08 MED ORDER — SODIUM CHLORIDE 0.9 % IV SOLN: 500.0000 mL | Freq: Once | INTRAVENOUS | Status: DC

## 2024-04-08 NOTE — Op Note (Signed)
 [facility] Patient Name: Alyssa Baker Procedure Date: 04/08/2024 1:56 PM MRN: [id number] Endoscopist: [name] , MD, [id number] Age: 53 Referring MD:  Date of Birth: 1971/01/11 Gender: Female Account #: [id number] Procedure:                Upper GI endoscopy Indications:              Abdominal pain, Dysphagia, Abdominal bloating,                            Diarrhea, Constipation, Vitamin B12 deficiency Medicines:                Monitored Anesthesia Care Procedure:                Pre-Anesthesia Assessment:                           - Prior to the procedure, a History and Physical                            was performed, and patient medications and                            allergies were reviewed. The patient's tolerance of                            previous anesthesia was also reviewed. The risks                            and benefits of the procedure and the sedation                            options and risks were discussed with the patient.                            All questions were answered, and informed consent                            was obtained. Prior Anticoagulants: The patient has                            taken no anticoagulant or antiplatelet agents. ASA                            Grade Assessment: II - A patient with mild systemic                            disease. After reviewing the risks and benefits,                            the patient was deemed in satisfactory condition to                            undergo the procedure.  After obtaining informed consent, the endoscope was                            passed under direct vision. Throughout the                            procedure, the patient's blood pressure, pulse, and                            oxygen saturations were monitored continuously. The                            Olympus Scope [id number] was introduced through the                            mouth,  and advanced to the second part of duodenum.                            The upper GI endoscopy was accomplished without                            difficulty. The patient tolerated the procedure                            well. Scope In: Scope Out: Findings:                 No endoscopic abnormality was evident in the                            esophagus to explain the patient's complaint of                            dysphagia. Biopsies were obtained from the proximal                            and distal esophagus with cold forceps for                            histology for evaluation of eosinophilic                            esophagitis.                           Diffuse moderately erythematous mucosa with a                            mosaic texture was found in the gastric fundus and                            in the gastric body. Biopsies were taken with a                            cold forceps for Helicobacter pylori testing.  The gastric antrum and cardia (on retroflexion)                            were normal. Biopsies were taken with a cold                            forceps for Helicobacter pylori testing.                           The duodenal bulb and second portion of the                            duodenum were normal. Biopsies for histology were                            taken with a cold forceps for evaluation of celiac                            disease. Complications:            No immediate complications. Estimated blood loss:                            Minimal. Estimated Blood Loss:     Estimated blood loss was minimal. Impression:               - No endoscopic esophageal abnormality to explain                            patient's dysphagia. - Biopsies were taken with a                            cold forceps for evaluation of eosinophilic                            esophagitis.                           - Erythematous mucosa with a  mosaic texture in the                            gastric fundus and gastric body. Biopsied.                           - Normal antrum and cardia. Biopsied.                           - Normal duodenal bulb and second portion of the                            duodenum. Biopsied. Recommendation:           - Await pathology results.                           - Perform a colonoscopy today.                           -  The findings and recommendations were discussed                            with the patient's family. [name], MD 04/08/2024 2:29:08 PM This report has been signed electronically.

## 2024-04-08 NOTE — Progress Notes (Signed)
 Pt's states no medical or surgical changes since previsit or office visit.

## 2024-04-08 NOTE — Op Note (Signed)
 [facility] Patient Name: Alyssa Baker Procedure Date: 04/08/2024 1:55 PM MRN: [id number] Endoscopist: [name] , MD, [id number] Age: 53 Referring MD:  Date of Birth: [birth year] Gender: Female Account #: [id number] Procedure:                Colonoscopy Indications:              Abdominal pain, Constipation, Diarrhea, Vitamin B12                            deficiency, Mother with colon polyps, No prior                            colonoscopy Medicines:                Monitored Anesthesia Care Procedure:                Pre-Anesthesia Assessment:                           - Prior to the procedure, a History and Physical                            was performed, and patient medications and                            allergies were reviewed. The patient's tolerance of                            previous anesthesia was also reviewed. The risks                            and benefits of the procedure and the sedation                            options and risks were discussed with the patient.                            All questions were answered, and informed consent                            was obtained. Prior Anticoagulants: The patient has                            taken no anticoagulant or antiplatelet agents. ASA                            Grade Assessment: II - A patient with mild systemic                            disease. After reviewing the risks and benefits,                            the patient was deemed in satisfactory condition to  undergo the procedure.                           After obtaining informed consent, the colonoscope                            was passed under direct vision. Throughout the                            procedure, the patient's blood pressure, pulse, and                            oxygen saturations were monitored continuously. The                            Olympus Scope SN: [id number] was introduced through                             the anus and advanced to the terminal ileum. The                            colonoscopy was performed without difficulty. The                            patient tolerated the procedure well. The quality                            of the bowel preparation was good. Anatomical                            landmarks were photographed. Scope In: 2:10:34 PM Scope Out: 2:26:41 PM Scope Withdrawal Time: 0 hours 10 minutes 33 seconds  Total Procedure Duration: 0 hours 16 minutes 7 seconds  Findings:                 Skin tags were found on perianal exam.                           The digital rectal exam was normal. Pertinent                            negatives include normal sphincter tone and no                            palpable rectal lesions.                           Normal mucosa was found in the entire colon.                            Biopsies for histology were taken with a cold                            forceps for evaluation of microscopic colitis.  A 4 mm polyp was found in the ascending colon. The                            polyp was sessile. The polyp was removed with a                            cold biopsy forceps. Resection and retrieval were                            complete.                           The terminal ileum appeared normal. Biopsies were                            taken with a cold forceps for histology.                           The retroflexed view of the distal rectum and anal                            verge was normal and showed no anal or rectal                            abnormalities. Complications:            No immediate complications. Estimated blood loss:                            Minimal. Estimated Blood Loss:     Estimated blood loss was minimal. Impression:               - Perianal skin tags found on perianal exam.                           - Normal mucosa in the entire examined colon.                             Biopsied.                           - One 4 mm polyp in the ascending colon, removed                            with a cold biopsy forceps. Resected and retrieved.                           - The examined portion of the ileum was normal.                            Biopsied.                           - The distal rectum and anal verge are normal on  retroflexion view. Recommendation:           - Discharge patient to home (ambulatory).                           - Await pathology results.                           - Repeat colonoscopy for surveillance based on                            pathology results.                           - The findings and recommendations were discussed                            with the patient's family.                           - Return to GI clinic in 4 -6 weeks with Dr.                            Suzann or APP.                           - Patient has a contact number available for                            emergencies. The signs and symptoms of potential                            delayed complications were discussed with the                            patient. Return to normal activities tomorrow.                            Written discharge instructions were provided to the                            patient. Inocente Suzann, MD 04/08/2024 2:34:47 PM This report has been signed electronically.

## 2024-04-08 NOTE — Patient Instructions (Addendum)
 Discharge patient to home (ambulatory).                           - Await pathology results.                           - Repeat colonoscopy for surveillance based on                            pathology results.                           - The findings and recommendations were discussed                            with the patient's family.                           - Return to GI clinic in 4 -6 weeks with Dr.                            Suzann or APP.                           - Patient has a contact number available for                            emergencies. The signs and symptoms of potential                            delayed complications were discussed with the                            patient. Return to normal activities tomorrow.                            Written dAwait pathology results.                           - Perform a colonoscopy today.                           - The findings and recommendations were discussed                            with the patient's family.ischarge instructions were provided to the                            patient.       YOU HAD AN ENDOSCOPIC PROCEDURE TODAY AT THE [facility]:   Refer to the procedure report that was given to you for any specific questions about what was found during the examination.  If the procedure report does not answer your questions, please call your gastroenterologist to clarify.  If you requested that your care partner not be given the details of your procedure findings, then the procedure report has been  included in a sealed envelope for you to review at your convenience later.  YOU SHOULD EXPECT: Some feelings of bloating in the abdomen. Passage of more gas than usual.  Walking can help get rid of the air that was put into your GI tract during the procedure and reduce the bloating. If you had a lower endoscopy (such as a colonoscopy or flexible sigmoidoscopy) you may notice spotting of blood in your stool  or on the toilet paper. If you underwent a bowel prep for your procedure, you may not have a normal bowel movement for a few days.  Please Note:  You might notice some irritation and congestion in your nose or some drainage.  This is from the oxygen used during your procedure.  There is no need for concern and it should clear up in a day or so.  SYMPTOMS TO REPORT IMMEDIATELY:  Following lower endoscopy (colonoscopy or flexible sigmoidoscopy):  Excessive amounts of blood in the stool  Significant tenderness or worsening of abdominal pains  Swelling of the abdomen that is new, acute  Fever of 100F or higher  Following upper endoscopy (EGD)  Vomiting of blood or coffee ground material  New chest pain or pain under the shoulder blades  Painful or persistently difficult swallowing  New shortness of breath  Fever of 100F or higher  Black, tarry-looking stools  For urgent or emergent issues, a gastroenterologist can be reached at any hour by calling [phone]. Do not use MyChart messaging for urgent concerns.    DIET:  We do recommend a small meal at first, but then you may proceed to your regular diet.  Drink plenty of fluids but you should avoid alcoholic beverages for 24 hours.  ACTIVITY:  You should plan to take it easy for the rest of today and you should NOT DRIVE or use heavy machinery until tomorrow (because of the sedation medicines used during the test).    FOLLOW UP: Our staff will call the number listed on your records the next business day following your procedure.  We will call around 7:15- 8:00 am to check on you and address any questions or concerns that you may have regarding the information given to you following your procedure. If we do not reach you, we will leave a message.     If any biopsies were taken you will be contacted by phone or by letter within the next 1-3 weeks.  Please call us  at [phone] if you have not heard about the biopsies in 3 weeks.     SIGNATURES/CONFIDENTIALITY: You and/or your care partner have signed paperwork which will be entered into your electronic medical record.  These signatures attest to the fact that that the information above on your After Visit Summary has been reviewed and is understood.  Full responsibility of the confidentiality of this discharge information lies with you and/or your care-partner.

## 2024-04-08 NOTE — Progress Notes (Signed)
 Called to room to assist during endoscopic procedure.  Patient ID and intended procedure confirmed with present staff. Received instructions for my participation in the procedure from the performing physician.

## 2024-04-08 NOTE — Progress Notes (Signed)
 Report to PACU, RN, vss, BBS= Clear.

## 2024-04-09 ENCOUNTER — Telehealth: Payer: Self-pay | Admitting: *Deleted

## 2024-04-09 NOTE — Telephone Encounter (Signed)
 No answer after post call. Left a message.

## 2024-04-10 ENCOUNTER — Ambulatory Visit

## 2024-04-10 DIAGNOSIS — E538 Deficiency of other specified B group vitamins: Secondary | ICD-10-CM | POA: Diagnosis not present

## 2024-04-10 MED ORDER — CYANOCOBALAMIN 1000 MCG/ML IJ SOLN
1000.0000 ug | Freq: Once | INTRAMUSCULAR | Status: AC
  Administered 2024-04-10: 1000 ug via INTRAMUSCULAR

## 2024-04-13 LAB — SURGICAL PATHOLOGY

## 2024-04-14 ENCOUNTER — Encounter: Payer: Self-pay | Admitting: Physician Assistant

## 2024-04-15 ENCOUNTER — Ambulatory Visit: Payer: Self-pay | Admitting: Pediatrics

## 2024-04-16 ENCOUNTER — Telehealth: Payer: Self-pay

## 2024-04-16 ENCOUNTER — Other Ambulatory Visit (HOSPITAL_COMMUNITY): Payer: Self-pay

## 2024-04-16 MED ORDER — BIS SUBCIT-METRONID-TETRACYC 140-125-125 MG PO CAPS: 3.0000 | ORAL_CAPSULE | Freq: Three times a day (TID) | ORAL | 0 refills | Status: DC

## 2024-04-16 MED ORDER — OMEPRAZOLE 20 MG PO CPDR: 20.0000 mg | DELAYED_RELEASE_CAPSULE | Freq: Two times a day (BID) | ORAL | 0 refills | Status: DC

## 2024-04-16 NOTE — Telephone Encounter (Signed)
 Noted

## 2024-04-16 NOTE — Telephone Encounter (Signed)
 Pharmacy Patient Advocate Encounter   Received notification from Physician's Office that prior authorization for Pylera 140-125-125MG  capsules is required/requested.   Insurance verification completed.   The patient is insured through [employer] .   Per test claim: PA not required. Already filled at pharmacy.

## 2024-04-16 NOTE — Addendum Note (Signed)
 Addended by: MERCER CRISTINO SAILOR on: 04/16/2024 08:27 AM   Modules accepted: Orders

## 2024-04-24 ENCOUNTER — Other Ambulatory Visit: Payer: Self-pay | Admitting: Physician Assistant

## 2024-04-29 ENCOUNTER — Ambulatory Visit: Admitting: Dermatology

## 2024-05-15 ENCOUNTER — Ambulatory Visit (INDEPENDENT_AMBULATORY_CARE_PROVIDER_SITE_OTHER)

## 2024-05-15 DIAGNOSIS — E538 Deficiency of other specified B group vitamins: Secondary | ICD-10-CM

## 2024-05-15 MED ORDER — CYANOCOBALAMIN 1000 MCG/ML IJ SOLN
1000.0000 ug | INTRAMUSCULAR | Status: AC
  Administered 2024-05-15 – 2024-06-26 (×2): 1000 ug via INTRAMUSCULAR

## 2024-06-07 ENCOUNTER — Other Ambulatory Visit: Payer: Self-pay | Admitting: Physician Assistant
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
## 2024-06-09 ENCOUNTER — Ambulatory Visit: Admitting: Pediatrics

## 2024-06-12 ENCOUNTER — Ambulatory Visit

## 2024-06-15 ENCOUNTER — Telehealth: Payer: Self-pay

## 2024-06-15 DIAGNOSIS — A048 Other specified bacterial intestinal infections: Secondary | ICD-10-CM

## 2024-06-15 NOTE — Telephone Encounter (Signed)
-----   Message from Nurse [place] B sent at 04/16/2024  8:26 AM EDT ----- Regarding: [employer]atherix H. pylori stool antigen Diatherix H. pylori stool antigen/need to order/[place]

## 2024-06-17 NOTE — Telephone Encounter (Signed)
 MyChart message sent to patient with lab reminder. [employer] H. Pylori stool kit placed at 2nd floor receptionist desk.  Demographics and insurance information attached with order.

## 2024-06-19 ENCOUNTER — Ambulatory Visit: Admitting: *Deleted

## 2024-06-26 ENCOUNTER — Ambulatory Visit (INDEPENDENT_AMBULATORY_CARE_PROVIDER_SITE_OTHER)

## 2024-06-26 DIAGNOSIS — E538 Deficiency of other specified B group vitamins: Secondary | ICD-10-CM

## 2024-07-31 ENCOUNTER — Ambulatory Visit

## 2024-08-05 ENCOUNTER — Other Ambulatory Visit: Payer: Self-pay | Admitting: Dermatology

## 2024-08-05 DIAGNOSIS — L209 Atopic dermatitis, unspecified: Secondary | ICD-10-CM

## 2024-08-07 ENCOUNTER — Encounter: Payer: Self-pay | Admitting: Pediatrics
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
## 2024-08-12 ENCOUNTER — Ambulatory Visit: Admitting: Pediatrics

## 2024-08-14 ENCOUNTER — Ambulatory Visit

## 2024-08-14 DIAGNOSIS — E538 Deficiency of other specified B group vitamins: Secondary | ICD-10-CM

## 2024-08-14 MED ORDER — CYANOCOBALAMIN 1000 MCG/ML IJ SOLN
1000.0000 ug | Freq: Once | INTRAMUSCULAR | Status: AC
  Administered 2024-08-14: 1000 ug via INTRAMUSCULAR

## 2024-08-25 ENCOUNTER — Encounter: Payer: Self-pay | Admitting: Pediatrics
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
## 2024-09-25 ENCOUNTER — Encounter: Payer: Self-pay | Admitting: Pediatrics

## 2024-09-25 ENCOUNTER — Ambulatory Visit: Admitting: Pediatrics

## 2024-09-25 VITALS — BP 124/76 | HR 86 | Ht 68.0 in | Wt 157.0 lb

## 2024-09-25 DIAGNOSIS — K2970 Gastritis, unspecified, without bleeding: Secondary | ICD-10-CM | POA: Diagnosis not present

## 2024-09-25 DIAGNOSIS — Z860101 Personal history of adenomatous and serrated colon polyps: Secondary | ICD-10-CM | POA: Diagnosis not present

## 2024-09-25 DIAGNOSIS — R1310 Dysphagia, unspecified: Secondary | ICD-10-CM | POA: Diagnosis not present

## 2024-09-25 DIAGNOSIS — A048 Other specified bacterial intestinal infections: Secondary | ICD-10-CM

## 2024-09-25 DIAGNOSIS — Z83719 Family history of colon polyps, unspecified: Secondary | ICD-10-CM | POA: Diagnosis not present

## 2024-09-25 DIAGNOSIS — K638219 Small intestinal bacterial overgrowth, unspecified: Secondary | ICD-10-CM | POA: Diagnosis not present

## 2024-09-25 DIAGNOSIS — K582 Mixed irritable bowel syndrome: Secondary | ICD-10-CM | POA: Diagnosis not present

## 2024-09-25 DIAGNOSIS — Z860100 Personal history of colon polyps, unspecified: Secondary | ICD-10-CM

## 2024-09-25 DIAGNOSIS — K580 Irritable bowel syndrome with diarrhea: Secondary | ICD-10-CM

## 2024-09-25 DIAGNOSIS — E538 Deficiency of other specified B group vitamins: Secondary | ICD-10-CM

## 2024-09-25 MED ORDER — CYANOCOBALAMIN 1000 MCG/ML IJ SOLN
1000.0000 ug | INTRAMUSCULAR | Status: AC
  Administered 2024-09-25: 1000 ug via INTRAMUSCULAR

## 2024-09-25 MED ORDER — HYOSCYAMINE SULFATE 0.125 MG SL SUBL: 0.1250 mg | SUBLINGUAL_TABLET | Freq: Four times a day (QID) | SUBLINGUAL | 3 refills | Status: AC | PRN

## 2024-09-25 MED ORDER — RIFAXIMIN 550 MG PO TABS: 550.0000 mg | ORAL_TABLET | Freq: Three times a day (TID) | ORAL | 0 refills | Status: AC

## 2024-09-25 NOTE — Patient Instructions (Signed)
 We have sent the following medications to your pharmacy for you to pick up at your convenience:  Xifaxan , Hyoscyamine   Take IB Lindalou  - 2 capsules three times a day.  Over the counter.  Please follow up in 3 months.  _______________________________________________________  If your blood pressure at your visit was 140/90 or greater, please contact your primary care physician to follow up on this.  _______________________________________________________  If you are age 53 or older, your body mass index should be between 23-30. Your Body mass index is 23.87 kg/m. If this is out of the aforementioned range listed, please consider follow up with your Primary Care Provider.  If you are age 44 or younger, your body mass index should be between 19-25. Your Body mass index is 23.87 kg/m. If this is out of the aformentioned range listed, please consider follow up with your Primary Care Provider.   ________________________________________________________  The [facility] providers would like to encourage you to use MYCHART to communicate with providers for non-urgent requests or questions.  Due to long hold times on the telephone, sending your provider a message by [facility] may be a faster and more efficient way to get a response.  Please allow 48 business hours for a response.  Please remember that this is for non-urgent requests.  _______________________________________________________  [facility] is using a team-based approach to care.  Your team is made up of your doctor and two to three APPS. Our APPS (Nurse Practitioners and Physician Assistants) work with your physician to ensure care continuity for you. They are fully qualified to address your health concerns and develop a treatment plan. They communicate directly with your gastroenterologist to care for you. Seeing the Advanced Practice Practitioners on your physician's team can help you by facilitating care more promptly, often  allowing for earlier appointments, access to diagnostic testing, procedures, and other specialty referrals.

## 2024-09-30 ENCOUNTER — Ambulatory Visit: Admitting: Dermatology

## 2024-10-23 ENCOUNTER — Ambulatory Visit

## 2024-11-18 ENCOUNTER — Ambulatory Visit: Admitting: Dermatology
